# Patient Record
Sex: Female | Born: 1945 | Race: Black or African American | Hispanic: No | Marital: Single | Smoking: Never smoker
Health system: Southern US, Community
[De-identification: ages and names within clinical notes are randomized; demographics above are authoritative.]

## PROBLEM LIST (undated history)

## (undated) DIAGNOSIS — T25022A Burn of unspecified degree of left foot, initial encounter: Secondary | ICD-10-CM

## (undated) DIAGNOSIS — G35 Multiple sclerosis: Secondary | ICD-10-CM

## (undated) DIAGNOSIS — E785 Hyperlipidemia, unspecified: Secondary | ICD-10-CM

## (undated) HISTORY — PX: OTHER SURGICAL HISTORY: SHX169

---

## 2010-03-02 DIAGNOSIS — G35 Multiple sclerosis: Secondary | ICD-10-CM | POA: Diagnosis present

## 2010-12-19 ENCOUNTER — Other Ambulatory Visit: Payer: Self-pay | Admitting: Geriatric Medicine

## 2011-01-14 ENCOUNTER — Emergency Department: Payer: Self-pay | Admitting: Emergency Medicine

## 2011-05-22 ENCOUNTER — Emergency Department: Payer: Self-pay

## 2011-07-13 ENCOUNTER — Other Ambulatory Visit: Payer: Self-pay | Admitting: Geriatric Medicine

## 2012-10-08 ENCOUNTER — Other Ambulatory Visit: Payer: Self-pay | Admitting: Family Medicine

## 2012-10-08 LAB — PROTIME-INR
INR: 2
Prothrombin Time: 22.6 secs — ABNORMAL HIGH (ref 11.5–14.7)

## 2017-09-06 DIAGNOSIS — I10 Essential (primary) hypertension: Secondary | ICD-10-CM | POA: Diagnosis present

## 2017-09-19 ENCOUNTER — Other Ambulatory Visit: Payer: Self-pay | Admitting: Internal Medicine

## 2017-09-19 DIAGNOSIS — R0789 Other chest pain: Secondary | ICD-10-CM

## 2017-09-19 DIAGNOSIS — I208 Other forms of angina pectoris: Secondary | ICD-10-CM

## 2017-09-28 ENCOUNTER — Ambulatory Visit
Admission: RE | Admit: 2017-09-28 | Discharge: 2017-09-28 | Disposition: A | Discharge status: Home / Self Care | Payer: Medicare Other | Source: Ambulatory Visit | Attending: Internal Medicine | Admitting: Internal Medicine

## 2017-09-28 DIAGNOSIS — R0789 Other chest pain: Secondary | ICD-10-CM | POA: Diagnosis present

## 2017-09-28 DIAGNOSIS — I208 Other forms of angina pectoris: Secondary | ICD-10-CM

## 2017-09-28 NOTE — Progress Notes (Signed)
*  PRELIMINARY RESULTS* Echocardiogram 2D Echocardiogram has been performed.  Cristela Blue 09/28/2017, 11:21 AM

## 2018-05-09 DIAGNOSIS — I82539 Chronic embolism and thrombosis of unspecified popliteal vein: Secondary | ICD-10-CM | POA: Diagnosis present

## 2019-01-15 ENCOUNTER — Other Ambulatory Visit (HOSPITAL_COMMUNITY): Payer: Self-pay | Admitting: Family Medicine

## 2019-01-20 LAB — NOVEL CORONAVIRUS, NAA: SARS-CoV-2, NAA: NOT DETECTED

## 2020-06-07 ENCOUNTER — Other Ambulatory Visit
Admission: RE | Admit: 2020-06-07 | Discharge: 2020-06-07 | Disposition: A | Discharge status: Home / Self Care | Payer: Medicare Other | Source: Ambulatory Visit | Attending: Internal Medicine | Admitting: Internal Medicine

## 2020-06-07 ENCOUNTER — Other Ambulatory Visit: Payer: Self-pay

## 2020-06-07 DIAGNOSIS — Z01812 Encounter for preprocedural laboratory examination: Secondary | ICD-10-CM | POA: Diagnosis present

## 2020-06-07 DIAGNOSIS — Z20822 Contact with and (suspected) exposure to covid-19: Secondary | ICD-10-CM | POA: Insufficient documentation

## 2020-06-08 ENCOUNTER — Encounter: Payer: Self-pay | Admitting: Internal Medicine

## 2020-06-08 LAB — SARS CORONAVIRUS 2 (TAT 6-24 HRS): SARS Coronavirus 2: NEGATIVE

## 2020-06-16 ENCOUNTER — Other Ambulatory Visit
Admission: RE | Admit: 2020-06-16 | Discharge: 2020-06-16 | Disposition: A | Discharge status: Home / Self Care | Payer: Medicare Other | Source: Ambulatory Visit | Attending: Internal Medicine | Admitting: Internal Medicine

## 2020-06-16 ENCOUNTER — Other Ambulatory Visit: Payer: Self-pay

## 2020-06-16 DIAGNOSIS — Z01812 Encounter for preprocedural laboratory examination: Secondary | ICD-10-CM | POA: Diagnosis present

## 2020-06-16 DIAGNOSIS — Z20822 Contact with and (suspected) exposure to covid-19: Secondary | ICD-10-CM | POA: Insufficient documentation

## 2020-06-16 LAB — SARS CORONAVIRUS 2 (TAT 6-24 HRS): SARS Coronavirus 2: NEGATIVE

## 2020-06-18 ENCOUNTER — Other Ambulatory Visit: Payer: Medicare Other

## 2020-06-21 ENCOUNTER — Ambulatory Visit: Payer: Medicare Other | Admitting: Certified Registered Nurse Anesthetist

## 2020-06-21 ENCOUNTER — Other Ambulatory Visit: Payer: Self-pay

## 2020-06-21 ENCOUNTER — Ambulatory Visit
Admission: RE | Admit: 2020-06-21 | Discharge: 2020-06-21 | Disposition: A | Discharge status: Skilled Nursing Facility | Payer: Medicare Other | Attending: Internal Medicine | Admitting: Internal Medicine

## 2020-06-21 ENCOUNTER — Encounter
Admission: RE | Disposition: A | Discharge status: Skilled Nursing Facility | Payer: Self-pay | Source: Home / Self Care | Attending: Internal Medicine

## 2020-06-21 ENCOUNTER — Encounter: Payer: Self-pay | Admitting: Internal Medicine

## 2020-06-21 DIAGNOSIS — R194 Change in bowel habit: Secondary | ICD-10-CM | POA: Diagnosis not present

## 2020-06-21 DIAGNOSIS — Z7901 Long term (current) use of anticoagulants: Secondary | ICD-10-CM | POA: Diagnosis not present

## 2020-06-21 DIAGNOSIS — Z86718 Personal history of other venous thrombosis and embolism: Secondary | ICD-10-CM | POA: Insufficient documentation

## 2020-06-21 DIAGNOSIS — R921 Mammographic calcification found on diagnostic imaging of breast: Secondary | ICD-10-CM | POA: Diagnosis present

## 2020-06-21 DIAGNOSIS — Z79899 Other long term (current) drug therapy: Secondary | ICD-10-CM | POA: Insufficient documentation

## 2020-06-21 HISTORY — DX: Multiple sclerosis: G35

## 2020-06-21 HISTORY — PX: COLONOSCOPY WITH PROPOFOL: SHX5780

## 2020-06-21 SURGERY — COLONOSCOPY WITH PROPOFOL
Anesthesia: General

## 2020-06-21 MED ORDER — LIDOCAINE HCL (CARDIAC) PF 100 MG/5ML IV SOSY
PREFILLED_SYRINGE | INTRAVENOUS | Status: DC | PRN
  Administered 2020-06-21: 80 mg via INTRAVENOUS

## 2020-06-21 MED ORDER — LIDOCAINE HCL (PF) 1 % IJ SOLN
INTRAMUSCULAR | Status: AC
  Administered 2020-06-21: 0.2 mL
  Filled 2020-06-21: qty 2

## 2020-06-21 MED ORDER — SODIUM CHLORIDE 0.9 % IV SOLN: INTRAVENOUS | Status: DC

## 2020-06-21 MED ORDER — PROPOFOL 500 MG/50ML IV EMUL
INTRAVENOUS | Status: DC | PRN
  Administered 2020-06-21: 140 ug/kg/min via INTRAVENOUS

## 2020-06-21 MED ORDER — PROPOFOL 10 MG/ML IV BOLUS
INTRAVENOUS | Status: DC | PRN
  Administered 2020-06-21: 40 mg via INTRAVENOUS

## 2020-06-21 NOTE — Progress Notes (Signed)
Dr Norma Fredrickson spoke to the patient and her caregiver at the bedside following her procedure.  Results of the procedure as well as the future treatment plan were discussed and the patient verbalized understanding.  Written procedure report as well as written discharge instructions provided to the patient.  Medical Necessity forms prepared for EMS transport to [facility] upon discharge.  Patient denies pain/discomfort.  Caregiver remains at the bedside.

## 2020-06-21 NOTE — Op Note (Signed)
[facility]enter[facility] Patient Name: Donna Adkins Procedure Date: 06/21/2020 3:24 PM MRN: [id number] Account #: [id number] Date of Birth: 28-Dec-1945 Admit Type: Outpatient Age: 74 Room: [facility] ENDO ROOM 2 Gender: Female Note Status: Finalized Procedure:             Colonoscopy Indications:           Hematochezia, Change in bowel habits Providers:             Boykin Nearing. Coby Antrobus MD, MD Medicines:             Propofol per Anesthesia Complications:         No immediate complications. Estimated blood loss: None. Procedure:             Pre-Anesthesia Assessment:                        - The risks and benefits of the procedure and the                         sedation options and risks were discussed with the                         patient. All questions were answered and informed                         consent was obtained.                        - Patient identification and proposed procedure were                         verified prior to the procedure by the nurse. The                         procedure was verified in the procedure room.                        - ASA Grade Assessment: III - A patient with severe                         systemic disease.                        - After reviewing the risks and benefits, the patient                         was deemed in satisfactory condition to undergo the                         procedure.                        After obtaining informed consent, the colonoscope was                         passed under direct vision. Throughout the procedure,                         the patient's blood pressure, pulse, and oxygen  saturations were monitored continuously. The procedure                         was aborted. The colonscope was not inserted.                         Medications were given. The colonoscopy was aborted                         due to inadequate bowel prep. Findings:      The perianal  and digital rectal examinations were normal. Pertinent       negatives include no palpable rectal lesions.      Copious amount of semiliquid stool came passing per rectum. It was       decided at this point to abort the procedure. Estimated blood loss:       none. Scope NOT inserted. Impression:            - The procedure was aborted due to inadequate bowel                         prep.                        - No specimens collected. Recommendation:        - Patient has a contact number available for                         emergencies. The signs and symptoms of potential                         delayed complications were discussed with the patient.                         Return to normal activities tomorrow. Written                         discharge instructions were provided to the patient.                        - Resume previous diet.                        - Continue present medications.                        - Resume Eliquis (apixaban) at prior dose today. Refer                         to managing physician for further adjustment of                         therapy.                        - I advise 'pre-prep' of colon for two weeks prior to                         next scheduled colonoscopy. Take Miralax 17g (one  capful) daily mixed in liquid of choice daily for two                         weeks leading up to the procedure IN ADDITION to the                         Large volume prep the day prior to the procedure.                        - Follow up in office with PA to manage constipation                         better, then reschedule colonoscopy in 1-3 months with                         low residue diet for 3 days with large volume prep day                         prior to the procedure.                        - The findings and recommendations were discussed with                         the patient. Diagnosis Code(s):     --- Professional ---                         K92.1, Melena (includes Hematochezia)                        R19.4, Change in bowel habit Attending Participation:      I personally performed the entire procedure. [name] MD, MD 06/21/2020 4:05:38 PM This report has been signed electronically. Number of Addenda: 0 Note Initiated On: 06/21/2020 3:24 PM Estimated Blood Loss:  Estimated blood loss: none.      [facility]

## 2020-06-21 NOTE — Interval H&P Note (Signed)
History and Physical Interval Note:  06/21/2020 1:30 PM  Donna Adkins  has presented today for surgery, with the diagnosis of CHANGE IN [name] HEMATCCHEZIA.  The various methods of treatment have been discussed with the patient and family. After consideration of risks, benefits and other options for treatment, the patient has consented to  Procedure(s): COLONOSCOPY WITH PROPOFOL (N/A) as a surgical intervention.  The patient's history has been reviewed, patient examined, no change in status, stable for surgery.  I have reviewed the patient's chart and labs.  Questions were answered to the patient's satisfaction.     [name], [name]

## 2020-06-21 NOTE — Transfer of Care (Signed)
Immediate Anesthesia Transfer of Care Note  Patient: Donna Adkins  Procedure(s) Performed: COLONOSCOPY WITH PROPOFOL (N/A )  Patient Location: PACU and Endoscopy Unit  Anesthesia Type:General  Level of Consciousness: drowsy  Airway & Oxygen Therapy: Patient Spontanous Breathing  Post-op Assessment: Report given to RN and Post -op Vital signs reviewed and stable  Post vital signs: Reviewed and stable  Last Vitals:  Vitals Value Taken Time  BP 105/60 06/21/20 1544  Temp 36.4 C 06/21/20 1544  Pulse 46 06/21/20 1546  Resp 15 06/21/20 1546  SpO2 100 % 06/21/20 1546  Vitals shown include unvalidated device data.  Last Pain:  Vitals:   06/21/20 1544  TempSrc: Temporal  PainSc: Asleep         Complications: No complications documented.

## 2020-06-21 NOTE — H&P (Signed)
Outpatient short stay form Pre-procedure 06/21/2020 1:27 PM Donna Adkins K. Norma Fredrickson, M.D.  Primary Physician: Judie Grieve, M.D.  Reason for visit:  Change in bowel habits, hematochezia  History of present illness:  Patient with hx of MS, DVT on Eliquis presents for diagnostic colonoscopy for chronic constipation and intemittent hematochezia. Patient is colonoscopy naive.    No current facility-administered medications for this encounter.  Medications Prior to Admission  Medication Sig Dispense Refill Last Dose  . apixaban (ELIQUIS) 2.5 MG TABS tablet Take by mouth 2 (two) times daily.     . Biotin 10 MG CAPS Take by mouth.     . Calcium-Vitamin D (CALTRATE 600 PLUS-VIT D PO) Take by mouth.     . Coenzyme Q10-Fish Oil-Vit E (CO-Q 10 OMEGA-3 FISH OIL) CAPS Take by mouth.     . fenofibrate (TRICOR) 48 MG tablet Take 48 mg by mouth daily.     Marland Kitchen gabapentin (NEURONTIN) 300 MG capsule Take 300 mg by mouth 2 (two) times daily.     Marland Kitchen GARLIC PO Take by mouth.     . Multiple Vitamin (MULTIVITAMIN) tablet Take 1 tablet by mouth daily.     . polyethylene glycol (MIRALAX / GLYCOLAX) 17 g packet Take 17 g by mouth daily.        No Known Allergies   Past Medical History:  Diagnosis Date  . Multiple sclerosis (HCC)     Review of systems:  Otherwise negative.    Physical Exam  Gen: Alert, oriented. Appears stated age.  HEENT: [place]/AT. PERRLA. Lungs: CTA, no wheezes. CV: RR nl S1, S2. Abd: soft, benign, no masses. BS+ Ext: No edema. Pulses 2+    Planned procedures: Proceed with colonoscopy. The patient understands the nature of the planned procedure, indications, risks, alternatives and potential complications including but not limited to bleeding, infection, perforation, damage to internal organs and possible oversedation/side effects from anesthesia. The patient agrees and gives consent to proceed.  Please refer to procedure notes for findings, recommendations and patient  disposition/instructions.     Azaryah Oleksy K. Norma Fredrickson, M.D. Gastroenterology 06/21/2020  1:27 PM

## 2020-06-21 NOTE — Anesthesia Procedure Notes (Signed)
Procedure Name: MAC Date/Time: 06/21/2020 3:28 PM Performed by: Lily Peer, Keshara Kiger, CRNA Pre-anesthesia Checklist: Patient identified, Emergency Drugs available, Suction available, Patient being monitored and Timeout performed Patient Re-evaluated:Patient Re-evaluated prior to induction Oxygen Delivery Method: Simple face mask Induction Type: IV induction

## 2020-06-21 NOTE — OR Nursing (Signed)
We were about to start procedure but copious amounts of liquid stool poured out.  Dr Norma Fredrickson cancelled  procedure

## 2020-06-21 NOTE — Anesthesia Preprocedure Evaluation (Signed)
Anesthesia Evaluation  Patient identified by MRN, date of birth, ID band Patient awake    Reviewed: Allergy & Precautions, NPO status , Patient's Chart, lab work & pertinent test results  Airway Mallampati: III       Dental  (+) Poor Dentition   Pulmonary neg pulmonary ROS,    Pulmonary exam normal breath sounds clear to auscultation       Cardiovascular negative cardio ROS Normal cardiovascular exam Rhythm:Regular Rate:Normal     Neuro/Psych MS negative psych ROS   GI/Hepatic negative GI ROS, Neg liver ROS,   Endo/Other  negative endocrine ROS  Renal/GU negative Renal ROS  negative genitourinary   Musculoskeletal negative musculoskeletal ROS (+)   Abdominal   Peds negative pediatric ROS (+)  Hematology negative hematology ROS (+)   Anesthesia Other Findings .Marland KitchenPast Medical History: No date: Multiple sclerosis (HCC)   Reproductive/Obstetrics negative OB ROS                             Anesthesia Physical Anesthesia Plan  ASA: III  Anesthesia Plan: General   Post-op Pain Management:    Induction: Intravenous  PONV Risk Score and Plan: 3 and Propofol infusion  Airway Management Planned: Nasal Cannula  Additional Equipment:   Intra-op Plan:   Post-operative Plan:   Informed Consent: I have reviewed the patients History and Physical, chart, labs and discussed the procedure including the risks, benefits and alternatives for the proposed anesthesia with the patient or authorized representative who has indicated his/her understanding and acceptance.       Plan Discussed with: CRNA, Anesthesiologist and Surgeon  Anesthesia Plan Comments:         Anesthesia Quick Evaluation

## 2020-06-22 ENCOUNTER — Encounter: Payer: Self-pay | Admitting: Internal Medicine

## 2020-06-22 NOTE — Anesthesia Postprocedure Evaluation (Signed)
Anesthesia Post Note  Patient: Donna Adkins  Procedure(s) Performed: COLONOSCOPY WITH PROPOFOL (N/A )  Patient location during evaluation: Endoscopy Anesthesia Type: General Level of consciousness: awake and awake and alert Pain management: pain level controlled Vital Signs Assessment: post-procedure vital signs reviewed and stable Respiratory status: spontaneous breathing Cardiovascular status: blood pressure returned to baseline Postop Assessment: no apparent nausea or vomiting Anesthetic complications: no   No complications documented.   Last Vitals:  Vitals:   06/21/20 1554 06/21/20 1604  BP: 137/77 131/74  Pulse: 66 64  Resp: 12 13  Temp:    SpO2: 100% 100%    Last Pain:  Vitals:   06/22/20 0733  TempSrc:   PainSc: 0-No pain                 Emilio Math

## 2020-07-19 ENCOUNTER — Other Ambulatory Visit: Payer: Self-pay

## 2020-07-19 ENCOUNTER — Other Ambulatory Visit
Admission: RE | Admit: 2020-07-19 | Discharge: 2020-07-19 | Disposition: A | Discharge status: Home / Self Care | Payer: Medicare Other | Source: Ambulatory Visit | Attending: Surgery | Admitting: Surgery

## 2020-07-19 DIAGNOSIS — Z01812 Encounter for preprocedural laboratory examination: Secondary | ICD-10-CM | POA: Insufficient documentation

## 2020-07-19 DIAGNOSIS — Z20822 Contact with and (suspected) exposure to covid-19: Secondary | ICD-10-CM | POA: Diagnosis not present

## 2020-07-20 ENCOUNTER — Encounter: Payer: Self-pay | Admitting: Internal Medicine

## 2020-07-20 LAB — SARS CORONAVIRUS 2 (TAT 6-24 HRS): SARS Coronavirus 2: NEGATIVE

## 2020-07-21 ENCOUNTER — Encounter: Payer: Self-pay | Admitting: Internal Medicine

## 2020-07-21 ENCOUNTER — Other Ambulatory Visit: Payer: Self-pay

## 2020-07-21 ENCOUNTER — Ambulatory Visit: Payer: Medicare Other | Admitting: Certified Registered Nurse Anesthetist

## 2020-07-21 ENCOUNTER — Ambulatory Visit
Admission: RE | Admit: 2020-07-21 | Discharge: 2020-07-21 | Disposition: A | Discharge status: Skilled Nursing Facility | Payer: Medicare Other | Attending: Internal Medicine | Admitting: Internal Medicine

## 2020-07-21 ENCOUNTER — Encounter
Admission: RE | Disposition: A | Discharge status: Skilled Nursing Facility | Payer: Self-pay | Source: Home / Self Care | Attending: Internal Medicine

## 2020-07-21 DIAGNOSIS — K641 Second degree hemorrhoids: Secondary | ICD-10-CM | POA: Insufficient documentation

## 2020-07-21 DIAGNOSIS — K5904 Chronic idiopathic constipation: Secondary | ICD-10-CM | POA: Insufficient documentation

## 2020-07-21 DIAGNOSIS — Z86718 Personal history of other venous thrombosis and embolism: Secondary | ICD-10-CM | POA: Diagnosis not present

## 2020-07-21 DIAGNOSIS — K5730 Diverticulosis of large intestine without perforation or abscess without bleeding: Secondary | ICD-10-CM | POA: Diagnosis not present

## 2020-07-21 DIAGNOSIS — G35 Multiple sclerosis: Secondary | ICD-10-CM | POA: Insufficient documentation

## 2020-07-21 DIAGNOSIS — Z79899 Other long term (current) drug therapy: Secondary | ICD-10-CM | POA: Diagnosis not present

## 2020-07-21 DIAGNOSIS — K921 Melena: Secondary | ICD-10-CM | POA: Diagnosis not present

## 2020-07-21 DIAGNOSIS — Z7901 Long term (current) use of anticoagulants: Secondary | ICD-10-CM | POA: Diagnosis not present

## 2020-07-21 HISTORY — DX: Burn of unspecified degree of left foot, initial encounter: T25.022A

## 2020-07-21 HISTORY — PX: COLONOSCOPY WITH PROPOFOL: SHX5780

## 2020-07-21 SURGERY — COLONOSCOPY WITH PROPOFOL
Anesthesia: General

## 2020-07-21 MED ORDER — EPHEDRINE SULFATE 50 MG/ML IJ SOLN
INTRAMUSCULAR | Status: DC | PRN
  Administered 2020-07-21: 10 mg via INTRAVENOUS

## 2020-07-21 MED ORDER — PROPOFOL 10 MG/ML IV BOLUS
INTRAVENOUS | Status: AC
  Filled 2020-07-21: qty 40

## 2020-07-21 MED ORDER — ESMOLOL HCL 100 MG/10ML IV SOLN
INTRAVENOUS | Status: DC | PRN
  Administered 2020-07-21: 20 mg via INTRAVENOUS

## 2020-07-21 MED ORDER — LIDOCAINE HCL (PF) 1 % IJ SOLN
INTRAMUSCULAR | Status: AC
  Filled 2020-07-21: qty 2

## 2020-07-21 MED ORDER — PROPOFOL 500 MG/50ML IV EMUL
INTRAVENOUS | Status: DC | PRN
  Administered 2020-07-21: 65 ug/kg/min via INTRAVENOUS

## 2020-07-21 MED ORDER — PROPOFOL 10 MG/ML IV BOLUS
INTRAVENOUS | Status: DC | PRN
  Administered 2020-07-21: 50 mg via INTRAVENOUS

## 2020-07-21 MED ORDER — LIDOCAINE HCL (PF) 2 % IJ SOLN
INTRAMUSCULAR | Status: AC
  Filled 2020-07-21: qty 10

## 2020-07-21 MED ORDER — LIDOCAINE HCL (CARDIAC) PF 100 MG/5ML IV SOSY
PREFILLED_SYRINGE | INTRAVENOUS | Status: DC | PRN
  Administered 2020-07-21: 60 mg via INTRAVENOUS

## 2020-07-21 MED ORDER — SODIUM CHLORIDE 0.9 % IV SOLN: INTRAVENOUS | Status: DC

## 2020-07-21 MED ORDER — DEXMEDETOMIDINE (PRECEDEX) IN NS 20 MCG/5ML (4 MCG/ML) IV SYRINGE
PREFILLED_SYRINGE | INTRAVENOUS | Status: AC
  Filled 2020-07-21: qty 5

## 2020-07-21 MED ORDER — ESMOLOL HCL 100 MG/10ML IV SOLN
INTRAVENOUS | Status: AC
  Filled 2020-07-21: qty 10
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
## 2020-07-21 NOTE — Interval H&P Note (Signed)
History and Physical Interval Note:  07/21/2020 10:51 AM  Donna Adkins  has presented today for surgery, with the diagnosis of CHRONIC CONSTIPATION CHANGE BH.  The various methods of treatment have been discussed with the patient and family. After consideration of risks, benefits and other options for treatment, the patient has consented to  Procedure(s) with comments: COLONOSCOPY WITH PROPOFOL (N/A) - EMT WILL BRING PATIENT as a surgical intervention.  The patient's history has been reviewed, patient examined, no change in status, stable for surgery.  I have reviewed the patient's chart and labs.  Questions were answered to the patient's satisfaction.     [place], [place]

## 2020-07-21 NOTE — Op Note (Signed)
[facility] Gastroenterology Patient Name: Donna Adkins Procedure Date: 07/21/2020 11:36 AM MRN: [id number] Account #: [id number] Date of Birth: 1946-03-08 Admit Type: Outpatient Age: 74 Room: [facility] ENDO ROOM 2 Gender: Female Note Status: Finalized Procedure:             Colonoscopy Indications:           Hematochezia, Change in bowel habits, Chronic                         idiopathic constipation Providers:             Boykin Nearing. Norma Fredrickson MD, MD Referring MD:          No Local Md, MD (Referring MD) Medicines:             Propofol per Anesthesia Complications:         No immediate complications. Procedure:             Pre-Anesthesia Assessment:                        - The risks and benefits of the procedure and the                         sedation options and risks were discussed with the                         patient. All questions were answered and informed                         consent was obtained.                        - Patient identification and proposed procedure were                         verified prior to the procedure by the nurse. The                         procedure was verified in the procedure room.                        - ASA Grade Assessment: III - A patient with severe                         systemic disease.                        - After reviewing the risks and benefits, the patient                         was deemed in satisfactory condition to undergo the                         procedure.                        After obtaining informed consent, the colonoscope was                         passed under direct vision. Throughout the procedure,  the patient's blood pressure, pulse, and oxygen                         saturations were monitored continuously. The                         Colonoscope was introduced through the anus and                         advanced to the the cecum, identified by appendiceal                          orifice and ileocecal valve. The colonoscopy was                         performed without difficulty. The patient tolerated                         the procedure well. The quality of the bowel                         preparation was good. The ileocecal valve, appendiceal                         orifice, and rectum were photographed. Findings:      The perianal exam findings include internal hemorrhoids that prolapse       with straining, but spontaneously regress to the resting position (Grade       II).      Non-bleeding internal hemorrhoids were found during retroflexion. The       hemorrhoids were Grade I (internal hemorrhoids that do not prolapse).      A few small-mouthed diverticula were found in the sigmoid colon.      The exam was otherwise without abnormality. Impression:            - Internal hemorrhoids that prolapse with straining,                         but spontaneously regress to the resting position                         (Grade II) found on perianal exam.                        - Non-bleeding internal hemorrhoids.                        - Diverticulosis in the sigmoid colon.                        - The examination was otherwise normal.                        - No specimens collected. Recommendation:        - Patient has a contact number available for                         emergencies. The signs and symptoms of potential  delayed complications were discussed with the patient.                         Return to normal activities tomorrow. Written                         discharge instructions were provided to the patient.                        - Resume previous diet.                        - Continue present medications.                        - No repeat colonoscopy due to current age (65 years                         or older) and the absence of colonic polyps.                        - High fiber diet.                         - Return to GI office PRN.                        - You do NOT require further colon cancer screening                         measures (Annual stool testing (i.e. hemoccult, FIT,                         cologuard), sigmoidoscopy, colonoscopy or CT                         colonography). You should share this recommendation                         with your Primary Care provider.                        - The findings and recommendations were discussed with                         the patient. Procedure Code(s):     --- Professional ---                        [phone], Colonoscopy, flexible; diagnostic, including                         collection of specimen(s) by brushing or washing, when                         performed (separate procedure) Diagnosis Code(s):     --- Professional ---                        K57.30, Diverticulosis of large intestine without  perforation or abscess without bleeding                        K59.04, Chronic idiopathic constipation                        R19.4, Change in bowel habit                        K92.1, Melena (includes Hematochezia)                        K64.1, Second degree hemorrhoids CPT copyright 2019 American Medical [employer]. All rights reserved. The codes documented in this report are preliminary and upon coder review may  be revised to meet current compliance requirements. [name] MD, MD 07/21/2020 12:16:07 PM This report has been signed electronically. Number of Addenda: 0 Note Initiated On: 07/21/2020 11:36 AM Scope Withdrawal Time: 0 hours 6 minutes 10 seconds  Total Procedure Duration: 0 hours 11 minutes 9 seconds  Estimated Blood Loss:  Estimated blood loss: none.      [facility]
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
## 2020-07-21 NOTE — Transfer of Care (Signed)
Immediate Anesthesia Transfer of Care Note  Patient: Donna Adkins  Procedure(s) Performed: COLONOSCOPY WITH PROPOFOL (N/A )  Patient Location: PACU and Endoscopy Unit  Anesthesia Type:General  Level of Consciousness: awake, oriented and patient cooperative  Airway & Oxygen Therapy: Patient Spontanous Breathing  Post-op Assessment: Report given to RN and Post -op Vital signs reviewed and stable  Post vital signs: Reviewed and stable  Last Vitals:  Vitals Value Taken Time  BP 102/67 07/21/20 1219  Temp 35.9 C 07/21/20 1219  Pulse 92 07/21/20 1223  Resp 14 07/21/20 1223  SpO2 99 % 07/21/20 1223  Vitals shown include unvalidated device data.  Last Pain:  Vitals:   07/21/20 1219  TempSrc: Temporal  PainSc: 0-No pain         Complications: No complications documented.

## 2020-07-22 ENCOUNTER — Encounter: Payer: Self-pay | Admitting: Internal Medicine

## 2020-07-22 NOTE — Anesthesia Postprocedure Evaluation (Signed)
Anesthesia Post Note  Patient: Donna Adkins  Procedure(s) Performed: COLONOSCOPY WITH PROPOFOL (N/A )  Patient location during evaluation: PACU Anesthesia Type: General Level of consciousness: awake and alert Pain management: pain level controlled Vital Signs Assessment: post-procedure vital signs reviewed and stable Respiratory status: spontaneous breathing, nonlabored ventilation and respiratory function stable Cardiovascular status: blood pressure returned to baseline and stable Postop Assessment: no apparent nausea or vomiting Anesthetic complications: no   No complications documented.   Last Vitals:  Vitals:   07/21/20 1240 07/21/20 1250  BP: 125/66 (!) 135/101  Pulse: 74 73  Resp: 17 16  Temp:    SpO2: 100% 100%    Last Pain:  Vitals:   07/22/20 0739  TempSrc:   PainSc: 0-No pain                 Karleen Hampshire

## 2021-02-10 ENCOUNTER — Emergency Department: Payer: Medicare Other

## 2021-02-10 ENCOUNTER — Emergency Department
Admission: EM | Admit: 2021-02-10 | Discharge: 2021-02-10 | Disposition: A | Discharge status: Home / Self Care | Payer: Medicare Other | Attending: Emergency Medicine | Admitting: Emergency Medicine

## 2021-02-10 ENCOUNTER — Encounter: Payer: Self-pay | Admitting: Emergency Medicine

## 2021-02-10 DIAGNOSIS — Z7901 Long term (current) use of anticoagulants: Secondary | ICD-10-CM | POA: Diagnosis not present

## 2021-02-10 DIAGNOSIS — S72052A Unspecified fracture of head of left femur, initial encounter for closed fracture: Secondary | ICD-10-CM | POA: Insufficient documentation

## 2021-02-10 DIAGNOSIS — S72009A Fracture of unspecified part of neck of unspecified femur, initial encounter for closed fracture: Secondary | ICD-10-CM

## 2021-02-10 DIAGNOSIS — X58XXXA Exposure to other specified factors, initial encounter: Secondary | ICD-10-CM | POA: Diagnosis not present

## 2021-02-10 DIAGNOSIS — S79912A Unspecified injury of left hip, initial encounter: Secondary | ICD-10-CM | POA: Diagnosis present

## 2021-02-10 DIAGNOSIS — R52 Pain, unspecified: Secondary | ICD-10-CM

## 2021-02-10 MED ORDER — OXYCODONE HCL 5 MG PO TABS
5.0000 mg | ORAL_TABLET | Freq: Four times a day (QID) | ORAL | 0 refills | Status: DC | PRN
Start: 2021-02-10 — End: 2021-10-03

## 2021-02-10 MED ORDER — OXYCODONE HCL 5 MG PO TABS
5.0000 mg | ORAL_TABLET | Freq: Once | ORAL | Status: AC
Start: 2021-02-10 — End: 2021-02-10
  Administered 2021-02-10: 5 mg via ORAL
  Filled 2021-02-10: qty 1

## 2021-02-10 NOTE — ED Notes (Signed)
[phone]

## 2021-02-10 NOTE — Discharge Instructions (Addendum)
Contact Dr. Odis Luster office for reevaluation and further treatment of your hip fracture. A prescription for pain medication was written to be taken as needed for pain to [facility] in [place].

## 2021-02-10 NOTE — ED Notes (Signed)
Pts POA is [employer] [phone]
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
## 2021-09-30 ENCOUNTER — Other Ambulatory Visit: Payer: Self-pay

## 2021-09-30 ENCOUNTER — Inpatient Hospital Stay
Admission: EM | Admit: 2021-09-30 | Discharge: 2021-10-03 | DRG: 481 | Disposition: A | Discharge status: Skilled Nursing Facility | Payer: Medicare Other | Source: Skilled Nursing Facility | Attending: Internal Medicine | Admitting: Internal Medicine

## 2021-09-30 ENCOUNTER — Emergency Department: Payer: Medicare Other

## 2021-09-30 DIAGNOSIS — Z7401 Bed confinement status: Secondary | ICD-10-CM

## 2021-09-30 DIAGNOSIS — I10 Essential (primary) hypertension: Secondary | ICD-10-CM | POA: Diagnosis present

## 2021-09-30 DIAGNOSIS — X58XXXA Exposure to other specified factors, initial encounter: Secondary | ICD-10-CM | POA: Diagnosis present

## 2021-09-30 DIAGNOSIS — I9581 Postprocedural hypotension: Secondary | ICD-10-CM | POA: Diagnosis not present

## 2021-09-30 DIAGNOSIS — Y92129 Unspecified place in nursing home as the place of occurrence of the external cause: Secondary | ICD-10-CM

## 2021-09-30 DIAGNOSIS — Z79899 Other long term (current) drug therapy: Secondary | ICD-10-CM

## 2021-09-30 DIAGNOSIS — S72001A Fracture of unspecified part of neck of right femur, initial encounter for closed fracture: Secondary | ICD-10-CM | POA: Diagnosis not present

## 2021-09-30 DIAGNOSIS — I82539 Chronic embolism and thrombosis of unspecified popliteal vein: Secondary | ICD-10-CM | POA: Diagnosis present

## 2021-09-30 DIAGNOSIS — Z7901 Long term (current) use of anticoagulants: Secondary | ICD-10-CM

## 2021-09-30 DIAGNOSIS — S7291XA Unspecified fracture of right femur, initial encounter for closed fracture: Secondary | ICD-10-CM

## 2021-09-30 DIAGNOSIS — G35 Multiple sclerosis: Secondary | ICD-10-CM | POA: Diagnosis present

## 2021-09-30 DIAGNOSIS — T45515A Adverse effect of anticoagulants, initial encounter: Secondary | ICD-10-CM | POA: Diagnosis present

## 2021-09-30 DIAGNOSIS — E785 Hyperlipidemia, unspecified: Secondary | ICD-10-CM | POA: Diagnosis present

## 2021-09-30 DIAGNOSIS — Z86718 Personal history of other venous thrombosis and embolism: Secondary | ICD-10-CM

## 2021-09-30 DIAGNOSIS — Z20822 Contact with and (suspected) exposure to covid-19: Secondary | ICD-10-CM | POA: Diagnosis present

## 2021-09-30 DIAGNOSIS — D62 Acute posthemorrhagic anemia: Secondary | ICD-10-CM | POA: Diagnosis present

## 2021-09-30 HISTORY — DX: Hyperlipidemia, unspecified: E78.5

## 2021-09-30 LAB — CBC WITH DIFFERENTIAL/PLATELET
Abs Immature Granulocytes: 0.01 10*3/uL (ref 0.00–0.07)
Basophils Absolute: 0 10*3/uL (ref 0.0–0.1)
Basophils Relative: 0 %
Eosinophils Absolute: 0.4 10*3/uL (ref 0.0–0.5)
Eosinophils Relative: 6 %
HCT: 33.2 % — ABNORMAL LOW (ref 36.0–46.0)
Hemoglobin: 10.8 g/dL — ABNORMAL LOW (ref 12.0–15.0)
Immature Granulocytes: 0 %
Lymphocytes Relative: 13 %
Lymphs Abs: 0.9 10*3/uL (ref 0.7–4.0)
MCH: 30.5 pg (ref 26.0–34.0)
MCHC: 32.5 g/dL (ref 30.0–36.0)
MCV: 93.8 fL (ref 80.0–100.0)
Monocytes Absolute: 0.7 10*3/uL (ref 0.1–1.0)
Monocytes Relative: 10 %
Neutro Abs: 4.8 10*3/uL (ref 1.7–7.7)
Neutrophils Relative %: 71 %
Platelets: 191 10*3/uL (ref 150–400)
RBC: 3.54 MIL/uL — ABNORMAL LOW (ref 3.87–5.11)
RDW: 14.8 % (ref 11.5–15.5)
WBC: 6.8 10*3/uL (ref 4.0–10.5)
nRBC: 0 % (ref 0.0–0.2)

## 2021-09-30 LAB — PROTIME-INR
INR: 1.4 — ABNORMAL HIGH (ref 0.8–1.2)
Prothrombin Time: 17.4 seconds — ABNORMAL HIGH (ref 11.4–15.2)

## 2021-09-30 LAB — BASIC METABOLIC PANEL
Anion gap: 5 (ref 5–15)
BUN: 12 mg/dL (ref 8–23)
CO2: 26 mmol/L (ref 22–32)
Calcium: 9.4 mg/dL (ref 8.9–10.3)
Chloride: 104 mmol/L (ref 98–111)
Creatinine, Ser: 0.59 mg/dL (ref 0.44–1.00)
GFR, Estimated: >60 mL/min (ref >60)
Glucose, Bld: 144 mg/dL — ABNORMAL HIGH (ref 70–99)
Potassium: 4 mmol/L (ref 3.5–5.1)
Sodium: 135 mmol/L (ref 135–145)

## 2021-09-30 LAB — APTT: aPTT: 44 seconds — ABNORMAL HIGH (ref 24–36)

## 2021-09-30 NOTE — ED Notes (Signed)
Patient transported to X-ray 
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
## 2021-09-30 NOTE — ED Triage Notes (Signed)
Pt c/o right leg pain. EMS reports pt c/o right leg pain, facility ordered x-ray that showed a femoral neck fracture.  ?

## 2021-10-01 ENCOUNTER — Encounter
Admission: EM | Disposition: A | Discharge status: Skilled Nursing Facility | Payer: Self-pay | Source: Skilled Nursing Facility | Attending: Internal Medicine

## 2021-10-01 ENCOUNTER — Inpatient Hospital Stay: Payer: Medicare Other

## 2021-10-01 ENCOUNTER — Inpatient Hospital Stay: Payer: Medicare Other | Admitting: Anesthesiology

## 2021-10-01 ENCOUNTER — Other Ambulatory Visit: Payer: Self-pay

## 2021-10-01 ENCOUNTER — Encounter: Payer: Self-pay | Admitting: Internal Medicine

## 2021-10-01 DIAGNOSIS — T45515A Adverse effect of anticoagulants, initial encounter: Secondary | ICD-10-CM | POA: Diagnosis present

## 2021-10-01 DIAGNOSIS — Z20822 Contact with and (suspected) exposure to covid-19: Secondary | ICD-10-CM | POA: Diagnosis present

## 2021-10-01 DIAGNOSIS — I10 Essential (primary) hypertension: Secondary | ICD-10-CM

## 2021-10-01 DIAGNOSIS — I9581 Postprocedural hypotension: Secondary | ICD-10-CM | POA: Diagnosis not present

## 2021-10-01 DIAGNOSIS — Z7901 Long term (current) use of anticoagulants: Secondary | ICD-10-CM | POA: Diagnosis not present

## 2021-10-01 DIAGNOSIS — X58XXXA Exposure to other specified factors, initial encounter: Secondary | ICD-10-CM | POA: Diagnosis present

## 2021-10-01 DIAGNOSIS — D62 Acute posthemorrhagic anemia: Secondary | ICD-10-CM | POA: Diagnosis present

## 2021-10-01 DIAGNOSIS — Z86718 Personal history of other venous thrombosis and embolism: Secondary | ICD-10-CM | POA: Diagnosis not present

## 2021-10-01 DIAGNOSIS — S72001A Fracture of unspecified part of neck of right femur, initial encounter for closed fracture: Secondary | ICD-10-CM | POA: Diagnosis present

## 2021-10-01 DIAGNOSIS — G35 Multiple sclerosis: Secondary | ICD-10-CM | POA: Diagnosis present

## 2021-10-01 DIAGNOSIS — E785 Hyperlipidemia, unspecified: Secondary | ICD-10-CM | POA: Diagnosis present

## 2021-10-01 DIAGNOSIS — Z7401 Bed confinement status: Secondary | ICD-10-CM | POA: Diagnosis not present

## 2021-10-01 DIAGNOSIS — Y92129 Unspecified place in nursing home as the place of occurrence of the external cause: Secondary | ICD-10-CM | POA: Diagnosis not present

## 2021-10-01 DIAGNOSIS — Z79899 Other long term (current) drug therapy: Secondary | ICD-10-CM | POA: Diagnosis not present

## 2021-10-01 DIAGNOSIS — I82539 Chronic embolism and thrombosis of unspecified popliteal vein: Secondary | ICD-10-CM

## 2021-10-01 HISTORY — PX: HIP PINNING,CANNULATED: SHX1758

## 2021-10-01 LAB — RESP PANEL BY RT-PCR (FLU A&B, COVID) ARPGX2
Influenza A by PCR: NEGATIVE
Influenza B by PCR: NEGATIVE
SARS Coronavirus 2 by RT PCR: NEGATIVE

## 2021-10-01 SURGERY — FIXATION, FEMUR, NECK, PERCUTANEOUS, USING SCREW
Anesthesia: General | Site: Hip | Laterality: Right

## 2021-10-01 MED ORDER — FENTANYL CITRATE (PF) 100 MCG/2ML IJ SOLN: 25.0000 ug | INTRAMUSCULAR | Status: DC | PRN

## 2021-10-01 MED ORDER — POLYETHYLENE GLYCOL 3350 17 G PO PACK
17.0000 g | PACK | Freq: Every day | ORAL | Status: DC
  Administered 2021-10-02 – 2021-10-03 (×2): 17 g via ORAL
  Filled 2021-10-01 (×2): qty 1

## 2021-10-01 MED ORDER — LIDOCAINE HCL (PF) 2 % IJ SOLN
INTRAMUSCULAR | Status: AC
  Filled 2021-10-01: qty 5

## 2021-10-01 MED ORDER — OXYCODONE HCL 5 MG PO TABS
5.0000 mg | ORAL_TABLET | Freq: Four times a day (QID) | ORAL | Status: DC | PRN
  Administered 2021-10-01: 5 mg via ORAL
  Filled 2021-10-01: qty 1

## 2021-10-01 MED ORDER — LACTATED RINGERS IV SOLN: INTRAVENOUS | Status: DC | PRN

## 2021-10-01 MED ORDER — MORPHINE SULFATE (PF) 2 MG/ML IV SOLN
1.0000 mg | INTRAVENOUS | Status: DC | PRN
  Administered 2021-10-01: 1 mg via INTRAVENOUS
  Filled 2021-10-01 (×2): qty 1

## 2021-10-01 MED ORDER — LACTATED RINGERS IV SOLN: INTRAVENOUS | Status: DC

## 2021-10-01 MED ORDER — FENTANYL CITRATE (PF) 100 MCG/2ML IJ SOLN
INTRAMUSCULAR | Status: DC | PRN
  Administered 2021-10-01: 50 ug via INTRAVENOUS
  Administered 2021-10-01 (×2): 25 ug via INTRAVENOUS
  Administered 2021-10-01: 50 ug via INTRAVENOUS

## 2021-10-01 MED ORDER — DOCUSATE SODIUM 100 MG PO CAPS
100.0000 mg | ORAL_CAPSULE | Freq: Every day | ORAL | Status: DC
  Administered 2021-10-02: 100 mg via ORAL
  Filled 2021-10-01 (×2): qty 1

## 2021-10-01 MED ORDER — CEFAZOLIN SODIUM-DEXTROSE 1-4 GM/50ML-% IV SOLN
1.0000 g | Freq: Three times a day (TID) | INTRAVENOUS | Status: DC
  Administered 2021-10-02 – 2021-10-03 (×5): 1 g via INTRAVENOUS
  Filled 2021-10-01 (×6): qty 50

## 2021-10-01 MED ORDER — PROPOFOL 10 MG/ML IV BOLUS
INTRAVENOUS | Status: DC | PRN
  Administered 2021-10-01: 100 mg via INTRAVENOUS

## 2021-10-01 MED ORDER — OXYCODONE HCL 5 MG PO TABS: 5.0000 mg | ORAL_TABLET | Freq: Once | ORAL | Status: DC | PRN

## 2021-10-01 MED ORDER — PHENYLEPHRINE HCL (PRESSORS) 10 MG/ML IV SOLN
INTRAVENOUS | Status: DC | PRN
  Administered 2021-10-01 (×4): 100 ug via INTRAVENOUS

## 2021-10-01 MED ORDER — FENTANYL CITRATE (PF) 100 MCG/2ML IJ SOLN
INTRAMUSCULAR | Status: AC
Start: 2021-10-01 — End: ?
  Filled 2021-10-01: qty 2

## 2021-10-01 MED ORDER — GABAPENTIN 300 MG PO CAPS
300.0000 mg | ORAL_CAPSULE | Freq: Two times a day (BID) | ORAL | Status: DC
  Administered 2021-10-01 – 2021-10-03 (×5): 300 mg via ORAL
  Filled 2021-10-01 (×5): qty 1

## 2021-10-01 MED ORDER — HYDROCODONE-ACETAMINOPHEN 5-325 MG PO TABS
1.0000 | ORAL_TABLET | Freq: Four times a day (QID) | ORAL | Status: DC | PRN
  Administered 2021-10-01 – 2021-10-03 (×4): 2 via ORAL
  Filled 2021-10-01 (×4): qty 2

## 2021-10-01 MED ORDER — OXYCODONE HCL 5 MG/5ML PO SOLN: 5.0000 mg | Freq: Once | ORAL | Status: DC | PRN

## 2021-10-01 MED ORDER — LIDOCAINE HCL (CARDIAC) PF 100 MG/5ML IV SOSY
PREFILLED_SYRINGE | INTRAVENOUS | Status: DC | PRN
  Administered 2021-10-01: 80 mg via INTRAVENOUS

## 2021-10-01 MED ORDER — ONDANSETRON HCL 4 MG/2ML IJ SOLN
INTRAMUSCULAR | Status: DC | PRN
  Administered 2021-10-01: 4 mg via INTRAVENOUS

## 2021-10-01 MED ORDER — CEFAZOLIN SODIUM 1 G IJ SOLR
INTRAMUSCULAR | Status: AC
  Filled 2021-10-01: qty 20

## 2021-10-01 MED ORDER — RISPERIDONE 0.5 MG PO TABS
0.5000 mg | ORAL_TABLET | Freq: Two times a day (BID) | ORAL | Status: DC
  Administered 2021-10-01 – 2021-10-03 (×3): 0.5 mg via ORAL
  Filled 2021-10-01 (×6): qty 1

## 2021-10-01 MED ORDER — ACETAMINOPHEN 10 MG/ML IV SOLN: 1000.0000 mg | Freq: Once | INTRAVENOUS | Status: DC | PRN

## 2021-10-01 MED ORDER — HYDROMORPHONE HCL 1 MG/ML IJ SOLN: 0.5000 mg | INTRAMUSCULAR | Status: DC | PRN

## 2021-10-01 MED ORDER — APIXABAN 2.5 MG PO TABS
2.5000 mg | ORAL_TABLET | Freq: Two times a day (BID) | ORAL | Status: DC
Start: 2021-10-01 — End: 2021-10-01
  Administered 2021-10-01: 2.5 mg via ORAL
  Filled 2021-10-01: qty 1

## 2021-10-01 MED ORDER — CEFAZOLIN SODIUM-DEXTROSE 2-3 GM-%(50ML) IV SOLR
INTRAVENOUS | Status: DC | PRN
  Administered 2021-10-01: 2 g via INTRAVENOUS

## 2021-10-01 MED ORDER — ADULT MULTIVITAMIN W/MINERALS CH
1.0000 | ORAL_TABLET | Freq: Every day | ORAL | Status: DC
  Administered 2021-10-02 – 2021-10-03 (×2): 1 via ORAL
  Filled 2021-10-01 (×2): qty 1

## 2021-10-01 MED ORDER — FENOFIBRATE 54 MG PO TABS
54.0000 mg | ORAL_TABLET | Freq: Every day | ORAL | Status: DC
  Administered 2021-10-02 – 2021-10-03 (×2): 54 mg via ORAL
  Filled 2021-10-01 (×3): qty 1

## 2021-10-01 MED ORDER — PROPOFOL 10 MG/ML IV BOLUS
INTRAVENOUS | Status: AC
  Filled 2021-10-01: qty 20

## 2021-10-01 MED ORDER — ONE-DAILY MULTI VITAMINS PO TABS: 1.0000 | ORAL_TABLET | Freq: Every day | ORAL | Status: DC

## 2021-10-01 MED ORDER — GLYCOPYRROLATE 0.2 MG/ML IJ SOLN
INTRAMUSCULAR | Status: AC
Start: 2021-10-01 — End: ?
  Filled 2021-10-01: qty 1

## 2021-10-01 MED ORDER — ONDANSETRON HCL 4 MG/2ML IJ SOLN: 4.0000 mg | Freq: Once | INTRAMUSCULAR | Status: DC | PRN

## 2021-10-01 MED ORDER — ENSURE ENLIVE PO LIQD
237.0000 mL | Freq: Two times a day (BID) | ORAL | Status: DC
  Administered 2021-10-02 – 2021-10-03 (×3): 237 mL via ORAL

## 2021-10-01 MED ORDER — TRAMADOL HCL 50 MG PO TABS
50.0000 mg | ORAL_TABLET | Freq: Four times a day (QID) | ORAL | Status: DC | PRN
  Administered 2021-10-02 – 2021-10-03 (×3): 50 mg via ORAL
  Filled 2021-10-01 (×4): qty 1

## 2021-10-01 MED ORDER — DEXAMETHASONE SODIUM PHOSPHATE 10 MG/ML IJ SOLN
INTRAMUSCULAR | Status: DC | PRN
Start: 2021-10-01 — End: 2021-10-01
  Administered 2021-10-01: 5 mg via INTRAVENOUS

## 2021-10-01 SURGICAL SUPPLY — 35 items
BLADE SURG SZ10 CARB STEEL (BLADE) ×3 IMPLANT
BNDG COHESIVE 6X5 TAN ST LF (GAUZE/BANDAGES/DRESSINGS) ×6 IMPLANT
DRAPE SURG 17X11 SM STRL (DRAPES) ×6 IMPLANT
DRAPE U-SHAPE 47X51 STRL (DRAPES) ×3 IMPLANT
DRSG OPSITE POSTOP 3X4 (GAUZE/BANDAGES/DRESSINGS) IMPLANT
DRSG OPSITE POSTOP 4X6 (GAUZE/BANDAGES/DRESSINGS) IMPLANT
DURAPREP 26ML APPLICATOR (WOUND CARE) ×6 IMPLANT
ELECT REM PT RETURN 9FT ADLT (ELECTROSURGICAL) ×3
ELECTRODE REM PT RTRN 9FT ADLT (ELECTROSURGICAL) ×1 IMPLANT
GAUZE SPONGE 4X4 12PLY STRL (GAUZE/BANDAGES/DRESSINGS) ×3 IMPLANT
GLOVE SURG ORTHO LTX SZ9 (GLOVE) ×6 IMPLANT
GLOVE SURG UNDER POLY LF SZ9 (GLOVE) ×3 IMPLANT
GOWN STRL REUS TWL 2XL XL LVL4 (GOWN DISPOSABLE) ×3 IMPLANT
GOWN STRL REUS W/ TWL LRG LVL3 (GOWN DISPOSABLE) ×1 IMPLANT
GOWN STRL REUS W/TWL LRG LVL3 (GOWN DISPOSABLE) ×2
GUIDEWIRE THREADED 2.8 (WIRE) ×6 IMPLANT
HOLDER FOLEY CATH W/STRAP (MISCELLANEOUS) IMPLANT
MANIFOLD NEPTUNE II (INSTRUMENTS) ×3 IMPLANT
MAT ABSORB  FLUID 56X50 GRAY (MISCELLANEOUS) ×2
MAT ABSORB FLUID 56X50 GRAY (MISCELLANEOUS) ×1 IMPLANT
NS IRRIG 1000ML POUR BTL (IV SOLUTION) ×3 IMPLANT
PACK HIP COMPR (MISCELLANEOUS) ×3 IMPLANT
SCREW CANN 16 THRD/85 6.5 (Screw) ×4 IMPLANT
SCREW CANN 16 THRD/90 6.5 (Screw) ×4 IMPLANT
SCREW CANN 16 THRD/90 7.3 (Screw) ×2 IMPLANT
SCREW CANN 32 THRD/110 7.3 (Screw) ×2 IMPLANT
SCREW CANN 32 THRD/90 7.3 (Screw) ×2 IMPLANT
STAPLER SKIN PROX 35W (STAPLE) ×3 IMPLANT
STRAP SAFETY 5IN WIDE (MISCELLANEOUS) ×3 IMPLANT
SUT VIC AB 0 CT1 36 (SUTURE) ×3 IMPLANT
SUT VIC AB 2-0 CT2 27 (SUTURE) ×3 IMPLANT
SUT VICRYL 0 AB UR-6 (SUTURE) ×6 IMPLANT
TRAY FOLEY MTR SLVR 16FR STAT (SET/KITS/TRAYS/PACK) IMPLANT
WASHER FOR 5.0 SCREWS (Washer) ×6 IMPLANT
WATER STERILE IRR 500ML POUR (IV SOLUTION) ×3 IMPLANT
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
## 2021-10-01 NOTE — Brief Op Note (Signed)
10/01/2021 ? ?8:30 PM ? ?PATIENT:  Donna Adkins  76 y.o. female ? ?PRE-OPERATIVE DIAGNOSIS:  hip fx ? ?POST-OPERATIVE DIAGNOSIS:  right hip fracture  ? ?PROCEDURE:  Procedure(s): ?CANNULATED HIP PINNING (Right) ? ?SURGEON:  Surgeon(s) and Role: ?   * Braydn Carneiro, Osvaldo Human, MD - Primary ? ?PHYSICIAN ASSISTANT:  ? ?ASSISTANTS: none  ? ?ANESTHESIA:   general ? ?EBL:  50 mL  ? ?BLOOD ADMINISTERED:none ? ?DRAINS: none  ? ?LOCAL MEDICATIONS USED:  NONE ? ?SPECIMEN:  No Specimen ? ?DISPOSITION OF SPECIMEN:  N/A ? ?COUNTS:  Correct.  ? ?TOURNIQUET:  * No tourniquets in log * ? ?DICTATION: .Note written in EPIC ? ?PLAN OF CARE: Admit to inpatient  ? ?PATIENT DISPOSITION:  PACU - hemodynamically stable. ?  ?Delay start of Pharmacological VTE agent (>24hrs) due to surgical blood loss or risk of bleeding: no ? ?

## 2021-10-01 NOTE — Assessment & Plan Note (Signed)
Chronic.  Patient lives in a nursing home.  Full CODE STATUS. ?

## 2021-10-01 NOTE — Plan of Care (Signed)

## 2021-10-01 NOTE — Assessment & Plan Note (Signed)
Stable

## 2021-10-01 NOTE — Assessment & Plan Note (Signed)
Admit to MedSurg bed.  Orthopedics consulted by EDP.  Continue n.p.o. status.  Continue pain medicine. ?

## 2021-10-01 NOTE — Progress Notes (Signed)
[facility]  -  at [facility] ? ? ?PATIENT NAME: Donna Adkins   ? ?MR#:  [id number] ? ?DATE OF BIRTH:  01-28-1946 ? ?SUBJECTIVE:  ?no family at bedside. Patient is a long-term resident at [facility] came in after right hip pain which started two days ago. She says she felt a pop after she was being turned in the bed for cleanup. X-rays showed right femoral neck fracture. ? ?Awaiting getting touch with DSS for consent for surgery ? ?VITALS:  ?Blood pressure 120/63, pulse 63, temperature 98.2 ?F (36.8 ?C), resp. rate 16, height 5\' 6"  (1.676 m), weight 73.9 kg, SpO2 96 %. ? ?PHYSICAL EXAMINATION:  ? ?GENERAL:  76 y.o.-year-old patient lying in the bed with no acute distress.  ?LUNGS: Normal breath sounds bilaterally, no wheezing, rales, rhonchi.  ?CARDIOVASCULAR: S1, S2 normal. No murmurs, rubs, or gallops.  ?ABDOMEN: Soft, nontender, nondistended. Bowel sounds present.  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 Decreased ROM right hip   ?NEUROLOGIC: nonfocal  patient is alert and awake ?SKIN: No obvious rash, lesion, or ulcer ? ?LABORATORY PANEL:  ?CBC ?Recent Labs  ?Lab 09/30/21 ?2320  ?WBC 6.8  ?HGB 10.8*  ?HCT 33.2*  ?PLT 191  ? ? ?Chemistries  ?Recent Labs  ?Lab 09/30/21 ?2320  ?NA 135  ?K 4.0  ?CL 104  ?CO2 26  ?GLUCOSE 144*  ?BUN 12  ?CREATININE 0.59  ?CALCIUM 9.4  ? ?Cardiac Enzymes ?No results for input(s): TROPONINI in the last 168 hours. ?RADIOLOGY:  ?DG Chest Portable 1 View ? ?Result Date: 10/01/2021 ?CLINICAL DATA:  Femoral neck fracture. EXAM: PORTABLE CHEST 1 VIEW COMPARISON:  None. FINDINGS: The patient is obliquely rotated to the left. The cardiac size is normal. There is no pneumothorax. There is calcification of the transverse aorta, unremarkable mediastinal configuration apart from slight aortic tortuosity. The lungs are slightly emphysematous but clear. No pleural effusion is evident. Osteopenia and mild thoracic  dextroscoliosis. No displaced rib fracture is visible. There are bilateral rim calcified breast implants. IMPRESSION: 1. No evidence of acute chest disease.  COPD. 2. Aortic atherosclerosis. 3. Osteopenia. Electronically Signed   By: 12/01/2021 M.D.   On: 10/01/2021 00:12  ? ?DG Hip Unilat W or Wo Pelvis 2-3 Views Right ? ?Result Date: 10/01/2021 ?CLINICAL DATA:  Initial evaluation for acute right leg pain. EXAM: DG HIP (WITH OR WITHOUT PELVIS) 2-3V RIGHT COMPARISON:  None available. FINDINGS: Severe osteopenia, limiting assessment for possible subtle acute nondisplaced fractures. There is a subacute to chronic fracture extending through the intertrochanteric left femoral neck with slight superior subluxation. Femoral head remains in normal alignment within the acetabulum. Subtle cortical step-off at the iliopectineal line suspicious for a subtle nondisplaced fracture, age indeterminate. Possible additional age indeterminate nondisplaced fracture through the superior left pubic ramus. Remainder of the bony pelvis grossly intact. Cortical step-off at the contralateral right femoral neck suspicious for an acute nondisplaced subcapital femoral neck fracture. Right femoral head remains in normal alignment within the acetabulum. Visualized soft tissues demonstrate no acute finding. Multiple scattered mildly prominent gas-filled loops of bowel noted overlying the mid and lower abdomen. IMPRESSION: 1. Cortical step-off at the right femoral neck, suspicious for an acute nondisplaced subcapital femoral neck fracture. Correlation with physical exam recommended. 2. Subacute to chronic fracture extending through the intertrochanteric left femoral neck. 3. Question additional subtle nondisplaced fractures involving the left iliopectineal line and left superior ramus, age indeterminate. 4. Severe osteopenia.  Electronically Signed   By: Donna Adkins M.D.   On: 10/01/2021 00:17   ? ?Assessment and Plan ?76 year old  African-American female with a history of multiple sclerosis, hypertension, history of DVT on Eliquis, hx of left hip fracture managed nonoperatively, lives at a local nursing home for the last 9 years, presents to the ER today with right hip pain.  Patient states that she felt a pop after she was being turned in bed ? ?X-rays showed a right femoral neck fracture. ? ?Close right hip fracture ?-- orthopedic surgery consultation with Dr.Nappo-- plans for surgery once consent obtained from DSS ?-- NPO ?-- IV fluids ?-- PRN pain meds ?-- patient is bedbound status ? ?chronic bedbound ?-- from [facility] ? ?history of multiple sclerosis ? ?chronic DVT of popliteal vein ?-- on eliquis-- will resume after surgery ? ? ? ? ?Procedures: ?Family communication :none ?Consults :Orthopedic ?CODE STATUS: Full ?DVT Prophylaxis :SCD ?Level of care: Med-Surg ?Status is: Inpatient ?Remains inpatient appropriate because: Hip fracture--pending surgery ?  ? ?TOTAL TIME TAKING CARE OF THIS PATIENT: 25 minutes.  ?>50% time spent on counselling and coordination of care ? ?Note: This dictation was prepared with Dragon dictation along with smaller phrase technology. Any transcriptional errors that result from this process are unintentional. ? ?Donna Adkins M.D  ? ? ?[facility]  ? ?CC: ?Primary care physician; Clovis Cao, MD  ?

## 2021-10-01 NOTE — Progress Notes (Signed)
Patient to OR

## 2021-10-01 NOTE — Subjective & Objective (Signed)
Chief complaint: Right hip pain ?History present illness: ?76 year old African-American female with a history of multiple sclerosis, hypertension, history of DVT on Eliquis, hx of left hip fracture managed nonoperatively, lives at a local nursing home for the last 9 years, presents to the ER today with right hip pain.  Patient states that she felt a pop after she was being turned in bed.  Patient brought to the ER for evaluation. ? ?Patient been bedbound for 9 years now. ? ?X-rays showed a right femoral neck fracture. ? ?Due to continued pain of her right hip, patient has agreed to right hip surgery now. ? ?EDP is discussed the case with orthopedics.  They feel confident they can manage the patient's fracture here.  They have elected to continue the patient's Eliquis. ? ?[facility] contacted for admission. ? ? ?

## 2021-10-01 NOTE — Anesthesia Preprocedure Evaluation (Signed)
Anesthesia Evaluation  ?Patient identified by MRN, date of birth, ID band ?Patient awake ? ?General Assessment Comment: ? ?AOx3, asking appropriate questions ? ?Reviewed: ?Allergy & Precautions, H&P , NPO status , Patient's Chart, lab work & pertinent test results ? ?History of Anesthesia Complications ?Negative for: history of anesthetic complications ? ?Airway ?Mallampati: II ? ?TM Distance: >3 FB ? ? ? ? Dental ? ?(+) Chipped, Poor Dentition, Missing ?  ?Pulmonary ?neg pulmonary ROS, neg sleep apnea, neg COPD, Patient abstained from smoking.Not current smoker,  ?  ? ?+ decreased breath sounds ? ? ? ? ? Cardiovascular ?Exercise Tolerance: Good ?METS(-) hypertension(-) angina+ DVT  ?(-) CAD, (-) Past MI and (-) Cardiac Stents (-) dysrhythmias  ?Rhythm:regular Rate:Normal ? ?On eliquis ?  ?Neuro/Psych ?Multiple sclerosis, well controlled no relapses. She is chronically bedbound. Has not had issues with her MS postoperatively before  ? Neuromuscular disease negative psych ROS  ? GI/Hepatic ?negative GI ROS, Neg liver ROS, neg GERD  ,  ?Endo/Other  ?negative endocrine ROSneg diabetes ? Renal/GU ?negative Renal ROS  ?negative genitourinary ?  ?Musculoskeletal ? ? Abdominal ?  ?Peds ? Hematology ?negative hematology ROS ?(+)   ?Anesthesia Other Findings ?Past Medical History: ?No date: Left foot burn ?No date: Multiple sclerosis (HCC) ? ?Past Surgical History: ?06/21/2020: COLONOSCOPY WITH PROPOFOL; N/A ?    Comment:  Procedure: COLONOSCOPY WITH PROPOFOL;  Surgeon: [place],  ?             Boykin Nearing, MD;  Location: [facility] ENDOSCOPY;  Service:  ?             Gastroenterology;  Laterality: N/A; ?No date: full thickness graft of leg ? ?BMI   ? Body Mass Index: 27.44 kg/m?  ?  ? ? Reproductive/Obstetrics ?negative OB ROS ? ?  ? ? ? ? ? ? ? ? ? ? ? ? ? ?  ?  ? ? ? ? ? ? ? ? ?Anesthesia Physical ? ?Anesthesia Plan ? ?ASA: 3 ? ?Anesthesia Plan: General  ? ?Post-op Pain Management: Ofirmev IV  (intra-op)* and Dilaudid IV  ? ?Induction: Intravenous ? ?PONV Risk Score and Plan: 4 or greater and Ondansetron, Dexamethasone and Treatment may vary due to age or medical condition ? ?Airway Management Planned: Oral ETT ? ?Additional Equipment: None ? ?Intra-op Plan:  ? ?Post-operative Plan: Extubation in OR ? ?Informed Consent: I have reviewed the patients History and Physical, chart, labs and discussed the procedure including the risks, benefits and alternatives for the proposed anesthesia with the patient or authorized representative who has indicated his/her understanding and acceptance.  ? ? ? ?Dental Advisory Given and Consent reviewed with POA ? ?Plan Discussed with: Anesthesiologist, CRNA and Surgeon ? ?Anesthesia Plan Comments: (Discussed risks of anesthesia with patient, including PONV, sore throat, lip/dental/eye damage. Rare risks discussed as well, such as cardiorespiratory and neurological sequelae, and allergic reactions. Discussed the role of CRNA in patient's perioperative care. Patient understands. ? ?Patient is a ward of the state. I obtained official consent from [facility] DSS by phone in presence of nurse witness (see paper consent).)  ? ? ? ? ? ? ?Anesthesia Quick Evaluation ? ?

## 2021-10-01 NOTE — Anesthesia Procedure Notes (Signed)
Procedure Name: LMA Insertion ?Date/Time: 10/01/2021 6:49 PM ?Performed by: Jeronimo Norma, CRNA ?Pre-anesthesia Checklist: Patient identified, Emergency Drugs available, Patient being monitored, Suction available and Timeout performed ?Patient Re-evaluated:Patient Re-evaluated prior to induction ?Oxygen Delivery Method: Circle system utilized ?Preoxygenation: Pre-oxygenation with 100% oxygen ?Induction Type: IV induction ?LMA: LMA inserted ?LMA Size: 4.0 ?Number of attempts: 2 ?Tube secured with: Tape ?Dental Injury: Teeth and Oropharynx as per pre-operative assessment  ?Comments: First attempt with LMA 3 with poor seal. Second attempt with LMA 4 successful ? ? ? ? ?

## 2021-10-01 NOTE — Assessment & Plan Note (Signed)
-   Continue Eliquis 

## 2021-10-01 NOTE — Assessment & Plan Note (Signed)
Chronic. 

## 2021-10-01 NOTE — Transfer of Care (Signed)
Immediate Anesthesia Transfer of Care Note ? ?Patient: Donna Adkins ? ?Procedure(s) Performed: CANNULATED HIP PINNING (Right: Hip) ? ?Patient Location: PACU ? ?Anesthesia Type:General ? ?Level of Consciousness: awake and patient cooperative ? ?Airway & Oxygen Therapy: Patient Spontanous Breathing and Patient connected to nasal cannula oxygen ? ?Post-op Assessment: Report given to RN and Post -op Vital signs reviewed and stable ? ?Post vital signs: Reviewed and stable ? ?Last Vitals:  ?Vitals Value Taken Time  ?BP 103/61 10/01/21 2023  ?Temp    ?Pulse 80 10/01/21 2026  ?Resp 14 10/01/21 2026  ?SpO2 86 % 10/01/21 2026  ?Vitals shown include unvalidated device data. ? ?Last Pain:  ?Vitals:  ? 10/01/21 1108  ?TempSrc:   ?PainSc: 6   ?   ? ?  ? ?Complications: No notable events documented. ?

## 2021-10-01 NOTE — OR Nursing (Signed)
Second attempt to contact DSS  social worker on call in regards to consent for surgery. ?

## 2021-10-01 NOTE — OR Nursing (Signed)
[employer] department contacted for DSS social worker on call. Pt is listed as a ward of the state and will need consent for her procedure. Waiting on return call. ?

## 2021-10-01 NOTE — Progress Notes (Signed)
Initial Nutrition Assessment ? ?DOCUMENTATION CODES:  ? ?Not applicable ? ?INTERVENTION:  ? ?Ensure Enlive po BID, each supplement provides 350 kcal and 20 grams of protein. ? ?MVI po daily  ? ?NUTRITION DIAGNOSIS:  ? ?Increased nutrient needs related to hip fracture as evidenced by estimated needs. ? ?GOAL:  ? ?Patient will meet greater than or equal to 90% of their needs ? ?MONITOR:  ? ?PO intake, Supplement acceptance, Labs, Weight trends, Skin, I & O's ? ?REASON FOR ASSESSMENT:  ? ?Consult ?Hip fracture protocol ? ?ASSESSMENT:  ? ?76 y/o female with h/o MS, bedbound resides at [facility], HTN, HLD and DVT who is admitted with hip fracture. ? ?RD working remotely. ? ?Unable to speak with pt via phone. Pt currently NPO for hip fracture repair. RD will add supplements and MVI to help pt meet her estimated needs. Per chart, pt is down 7lbs(4%) since July; RD unsure how recently wt loss occurred. RD will obtain nutrition related history and exam and follow-up.   ? ?Medications reviewed and include: LRS @50ml /hr ? ?Labs reviewed: K 4.0 wnl ?Hgb 10.8(L), Hct 33.2(L) ? ?NUTRITION - FOCUSED PHYSICAL EXAM: ?Unable to perform at this time  ? ?Diet Order:   ?Diet Order   ? ?       ?  Diet NPO time specified  Diet effective now       ?  ? ?  ?  ? ?  ? ?EDUCATION NEEDS:  ? ?Not appropriate for education at this time ? ?Skin:  Skin Assessment: Reviewed RN Assessment ? ?Last BM:  PTA ? ?Height:  ? ?Ht Readings from Last 1 Encounters:  ?10/01/21 5\' 6"  (1.676 m)  ? ? ?Weight:  ? ?Wt Readings from Last 1 Encounters:  ?10/01/21 73.9 kg  ? ? ?Ideal Body Weight:  59 kg ? ?BMI:  Body mass index is 26.29 kg/m?. ? ?Estimated Nutritional Needs:  ? ?Kcal:  1500-1700kcal/day ? ?Protein:  75-85g/day ? ?Fluid:  1.5-1.7L/day ? ? MS, RD, LDN ?Please refer to AMION for RD and/or RD on-call/weekend/after hours pager ? ?

## 2021-10-02 DIAGNOSIS — S72001A Fracture of unspecified part of neck of right femur, initial encounter for closed fracture: Secondary | ICD-10-CM | POA: Diagnosis not present

## 2021-10-02 LAB — CBC WITH DIFFERENTIAL/PLATELET
Abs Immature Granulocytes: 0.08 10*3/uL — ABNORMAL HIGH (ref 0.00–0.07)
Basophils Absolute: 0 10*3/uL (ref 0.0–0.1)
Basophils Relative: 0 %
Eosinophils Absolute: 0 10*3/uL (ref 0.0–0.5)
Eosinophils Relative: 0 %
HCT: 26.4 % — ABNORMAL LOW (ref 36.0–46.0)
Hemoglobin: 8.7 g/dL — ABNORMAL LOW (ref 12.0–15.0)
Immature Granulocytes: 1 %
Lymphocytes Relative: 5 %
Lymphs Abs: 0.7 10*3/uL (ref 0.7–4.0)
MCH: 31.2 pg (ref 26.0–34.0)
MCHC: 33 g/dL (ref 30.0–36.0)
MCV: 94.6 fL (ref 80.0–100.0)
Monocytes Absolute: 0.5 10*3/uL (ref 0.1–1.0)
Monocytes Relative: 3 %
Neutro Abs: 12.3 10*3/uL — ABNORMAL HIGH (ref 1.7–7.7)
Neutrophils Relative %: 91 %
Platelets: 187 10*3/uL (ref 150–400)
RBC: 2.79 MIL/uL — ABNORMAL LOW (ref 3.87–5.11)
RDW: 14.9 % (ref 11.5–15.5)
WBC: 13.6 10*3/uL — ABNORMAL HIGH (ref 4.0–10.5)
nRBC: 0 % (ref 0.0–0.2)

## 2021-10-02 LAB — GLUCOSE, CAPILLARY: Glucose-Capillary: 160 mg/dL — ABNORMAL HIGH (ref 70–99)

## 2021-10-02 LAB — MAGNESIUM: Magnesium: 1.6 mg/dL — ABNORMAL LOW (ref 1.7–2.4)

## 2021-10-02 LAB — COMPREHENSIVE METABOLIC PANEL
ALT: 12 U/L (ref 0–44)
AST: 23 U/L (ref 15–41)
Albumin: 2.1 g/dL — ABNORMAL LOW (ref 3.5–5.0)
Alkaline Phosphatase: 43 U/L (ref 38–126)
Anion gap: 12 (ref 5–15)
BUN: 13 mg/dL (ref 8–23)
CO2: 19 mmol/L — ABNORMAL LOW (ref 22–32)
Calcium: 8.8 mg/dL — ABNORMAL LOW (ref 8.9–10.3)
Chloride: 104 mmol/L (ref 98–111)
Creatinine, Ser: 0.6 mg/dL (ref 0.44–1.00)
GFR, Estimated: >60 mL/min (ref >60)
Glucose, Bld: 160 mg/dL — ABNORMAL HIGH (ref 70–99)
Potassium: 4.4 mmol/L (ref 3.5–5.1)
Sodium: 135 mmol/L (ref 135–145)
Total Bilirubin: 0.9 mg/dL (ref 0.3–1.2)
Total Protein: 5.3 g/dL — ABNORMAL LOW (ref 6.5–8.1)

## 2021-10-02 LAB — PREPARE RBC (CROSSMATCH)

## 2021-10-02 LAB — HEMOGLOBIN AND HEMATOCRIT, BLOOD
HCT: 30.4 % — ABNORMAL LOW (ref 36.0–46.0)
Hemoglobin: 10.2 g/dL — ABNORMAL LOW (ref 12.0–15.0)

## 2021-10-02 MED ORDER — SODIUM CHLORIDE 0.9 % IV BOLUS
500.0000 mL | Freq: Once | INTRAVENOUS | Status: AC
  Administered 2021-10-02: 500 mL via INTRAVENOUS

## 2021-10-02 MED ORDER — MAGNESIUM SULFATE 4 GM/100ML IV SOLN
4.0000 g | Freq: Once | INTRAVENOUS | Status: AC
  Administered 2021-10-02: 4 g via INTRAVENOUS
  Filled 2021-10-02: qty 100

## 2021-10-02 MED ORDER — SODIUM CHLORIDE 0.9% IV SOLUTION: Freq: Once | INTRAVENOUS | Status: AC

## 2021-10-02 NOTE — Progress Notes (Addendum)
[place] at [facility] ? ? ?PATIENT NAME: Donna Adkins   ? ?MR#:  [id number] ? ?DATE OF BIRTH:  1945/08/29 ? ?SUBJECTIVE:  ?no family at bedside. Patient is a long-term resident at [facility] came in after right hip pain which started two days ago. She says she felt a pop after she was being turned in the bed for cleanup. X-rays showed right femoral neck fracture. ? ?POD #1  s/p BT hgb 10.4 today ?BP soft ? ?VITALS:  ?Blood pressure 96/61, pulse 89, temperature 98.3 ?F (36.8 ?C), resp. rate 17, height 5\' 6"  (1.676 m), weight 73.9 kg, SpO2 96 %. ? ?PHYSICAL EXAMINATION:  ? ?GENERAL:  76 y.o.-year-old patient lying in the bed with no acute distress.  ?LUNGS: Normal breath sounds bilaterally, no wheezing, rales, rhonchi.  ?CARDIOVASCULAR: S1, S2 normal. No murmurs, rubs, or gallops.  ?ABDOMEN: Soft, nontender, nondistended. Bowel sounds present.  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 Decreased ROM right hip   ?NEUROLOGIC: nonfocal  patient is alert and awake ?SKIN: No obvious rash, lesion, or ulcer ? ?LABORATORY PANEL:  ?CBC ?Recent Labs  ?Lab 10/02/21 ?0329 10/02/21 ?1034  ?WBC 13.6*  --   ?HGB 8.7* 10.2*  ?HCT 26.4* 30.4*  ?PLT 187  --   ? ? ? ?Chemistries  ?Recent Labs  ?Lab 10/02/21 ?0329  ?NA 135  ?K 4.4  ?CL 104  ?CO2 19*  ?GLUCOSE 160*  ?BUN 13  ?CREATININE 0.60  ?CALCIUM 8.8*  ?MG 1.6*  ?AST 23  ?ALT 12  ?ALKPHOS 43  ?BILITOT 0.9  ? ? ?Cardiac Enzymes ?No results for input(s): TROPONINI in the last 168 hours. ?RADIOLOGY:  ?DG Chest Portable 1 View ? ?Result Date: 10/01/2021 ?CLINICAL DATA:  Femoral neck fracture. EXAM: PORTABLE CHEST 1 VIEW COMPARISON:  None. FINDINGS: The patient is obliquely rotated to the left. The cardiac size is normal. There is no pneumothorax. There is calcification of the transverse aorta, unremarkable mediastinal configuration apart from slight aortic tortuosity. The lungs are slightly emphysematous  but clear. No pleural effusion is evident. Osteopenia and mild thoracic dextroscoliosis. No displaced rib fracture is visible. There are bilateral rim calcified breast implants. IMPRESSION: 1. No evidence of acute chest disease.  COPD. 2. Aortic atherosclerosis. 3. Osteopenia. Electronically Signed   By: Telford Nab M.D.   On: 10/01/2021 00:12  ? ?DG C-Arm 1-60 Min-No Report ? ?Result Date: 10/01/2021 ?Fluoroscopy was utilized by the requesting physician.  No radiographic interpretation.  ? ?DG C-Arm 1-60 Min-No Report ? ?Result Date: 10/01/2021 ?Fluoroscopy was utilized by the requesting physician.  No radiographic interpretation.  ? ?DG HIP UNILAT WITH PELVIS 2-3 VIEWS RIGHT ? ?Result Date: 10/01/2021 ?CLINICAL DATA:  Fracture, internal fixation EXAM: DG HIP (WITH OR WITHOUT PELVIS) 2-3V RIGHT COMPARISON:  09/30/2021 FINDINGS: Multiple intraoperative spot images demonstrate internal fixation across the right femoral neck fracture. No hardware complicating feature. Anatomic alignment. IMPRESSION: Internal fixation.  No complicating feature. Electronically Signed   By: Rolm Baptise M.D.   On: 10/01/2021 20:48  ? ?DG Hip Unilat W or Wo Pelvis 2-3 Views Right ? ?Result Date: 10/01/2021 ?CLINICAL DATA:  Initial evaluation for acute right leg pain. EXAM: DG HIP (WITH OR WITHOUT PELVIS) 2-3V RIGHT COMPARISON:  None available. FINDINGS: Severe osteopenia, limiting assessment for possible subtle acute nondisplaced fractures. There is a subacute to chronic fracture extending through the intertrochanteric left femoral neck with slight superior subluxation. Femoral head  remains in normal alignment within the acetabulum. Subtle cortical step-off at the iliopectineal line suspicious for a subtle nondisplaced fracture, age indeterminate. Possible additional age indeterminate nondisplaced fracture through the superior left pubic ramus. Remainder of the bony pelvis grossly intact. Cortical step-off at the contralateral right  femoral neck suspicious for an acute nondisplaced subcapital femoral neck fracture. Right femoral head remains in normal alignment within the acetabulum. Visualized soft tissues demonstrate no acute finding. Multiple scattered mildly prominent gas-filled loops of bowel noted overlying the mid and lower abdomen. IMPRESSION: 1. Cortical step-off at the right femoral neck, suspicious for an acute nondisplaced subcapital femoral neck fracture. Correlation with physical exam recommended. 2. Subacute to chronic fracture extending through the intertrochanteric left femoral neck. 3. Question additional subtle nondisplaced fractures involving the left iliopectineal line and left superior ramus, age indeterminate. 4. Severe osteopenia. Electronically Signed   By: Jeannine Boga M.D.   On: 10/01/2021 00:17   ? ?Assessment and Plan ?76 year old African-American female with a history of multiple sclerosis, hypertension, history of DVT on Eliquis, hx of left hip fracture managed nonoperatively, lives at a local nursing home for the last 9 years, presents to the ER today with right hip pain.  Patient states that she felt a pop after she was being turned in bed ? ?X-rays showed a right femoral neck fracture. ? ?Close right hip fracture, no fall ?-- orthopedic surgery consultation with Dr.Nappo/bowers ?-- IV fluids ?-- PRN pain meds ?-- patient is bedbound status ?--POD#1 received 1 unit BT in the setting of post op hypotension. ?Hgb 10.4 ? ?chronic bedbound ?-- from [facility] ? ?history of multiple sclerosis ? ?chronic DVT of popliteal vein ?-- on eliquis-- will resume after surgery when ok with ortho ? ? ? ? ?Procedures:Right hip percutaneous screw fixation ?Family communication :none ?Consults :Orthopedic ?CODE STATUS: Full ?DVT Prophylaxis :SCD ?Level of care: Med-Surg ?Status is: Inpatient ?Remains inpatient appropriate because: Hip fracture--pending surgery ?  ? ?Pt will return to [facility] in 1-2 days ? ? ?TOTAL TIME  TAKING CARE OF THIS PATIENT: 25 minutes.  ?>50% time spent on counselling and coordination of care ? ?Note: This dictation was prepared with Dragon dictation along with smaller phrase technology. Any transcriptional errors that result from this process are unintentional. ? ?Fritzi Mandes M.D  ? ? ?[facility]  ? ?CC: ?Primary care physician; Gennie Alma, MD  ?

## 2021-10-02 NOTE — Plan of Care (Signed)

## 2021-10-02 NOTE — Progress Notes (Signed)
Cross Cover ?S/P hip fx repair in setting of eliquis therapy - Large volume of blood from surgical site. Surgeon made aware by nursing and instructed to reinforce the surgical site.   Patient hypotensive pale and tachycardic ?Stat CBC shows 2 gm drop of HGB in less than 24 hours,  ?1 unit packed red blood cell transfusion ordered after discussion risks and benefits with patient ? ?

## 2021-10-02 NOTE — Progress Notes (Signed)
?Subjective: ? ?Patient reports pain as mild.   ? ?Objective:  ? ?VITALS:   ?Vitals:  ? 10/02/21 0515 10/02/21 2979 10/02/21 0807 10/02/21 0842  ?BP: (!) 83/60 (!) 87/58 90/62 96/61   ?Pulse: (!) 101 96 97 89  ?Resp: 18 16 18 17   ?Temp: 97.7 ?F (36.5 ?C) 97.6 ?F (36.4 ?C) 98.4 ?F (36.9 ?C) 98.3 ?F (36.8 ?C)  ?TempSrc: Oral Oral Oral   ?SpO2: 96% 96% 96% 96%  ?Weight:      ?Height:      ? ? ?PHYSICAL EXAM: ? ?No cellulitis present ?Compartment soft ?Dressing is clean and dry ? ?LABS ? ?Results for orders placed or performed during the hospital encounter of 09/30/21 (from the past 24 hour(s))  ?Glucose, capillary     Status: Abnormal  ? Collection Time: 10/02/21  3:12 AM  ?Result Value Ref Range  ? Glucose-Capillary 160 (H) 70 - 99 mg/dL  ?CBC with Differential/Platelet     Status: Abnormal  ? Collection Time: 10/02/21  3:29 AM  ?Result Value Ref Range  ? WBC 13.6 (H) 4.0 - 10.5 K/uL  ? RBC 2.79 (L) 3.87 - 5.11 MIL/uL  ? Hemoglobin 8.7 (L) 12.0 - 15.0 g/dL  ? HCT 26.4 (L) 36.0 - 46.0 %  ? MCV 94.6 80.0 - 100.0 fL  ? MCH 31.2 26.0 - 34.0 pg  ? MCHC 33.0 30.0 - 36.0 g/dL  ? RDW 14.9 11.5 - 15.5 %  ? Platelets 187 150 - 400 K/uL  ? nRBC 0.0 0.0 - 0.2 %  ? Neutrophils Relative % 91 %  ? Neutro Abs 12.3 (H) 1.7 - 7.7 K/uL  ? Lymphocytes Relative 5 %  ? Lymphs Abs 0.7 0.7 - 4.0 K/uL  ? Monocytes Relative 3 %  ? Monocytes Absolute 0.5 0.1 - 1.0 K/uL  ? Eosinophils Relative 0 %  ? Eosinophils Absolute 0.0 0.0 - 0.5 K/uL  ? Basophils Relative 0 %  ? Basophils Absolute 0.0 0.0 - 0.1 K/uL  ? Immature Granulocytes 1 %  ? Abs Immature Granulocytes 0.08 (H) 0.00 - 0.07 K/uL  ?Comprehensive metabolic panel     Status: Abnormal  ? Collection Time: 10/02/21  3:29 AM  ?Result Value Ref Range  ? Sodium 135 135 - 145 mmol/L  ? Potassium 4.4 3.5 - 5.1 mmol/L  ? Chloride 104 98 - 111 mmol/L  ? CO2 19 (L) 22 - 32 mmol/L  ? Glucose, Bld 160 (H) 70 - 99 mg/dL  ? BUN 13 8 - 23 mg/dL  ? Creatinine, Ser 0.60 0.44 - 1.00 mg/dL  ? Calcium 8.8 (L)  8.9 - 10.3 mg/dL  ? Total Protein 5.3 (L) 6.5 - 8.1 g/dL  ? Albumin 2.1 (L) 3.5 - 5.0 g/dL  ? AST 23 15 - 41 U/L  ? ALT 12 0 - 44 U/L  ? Alkaline Phosphatase 43 38 - 126 U/L  ? Total Bilirubin 0.9 0.3 - 1.2 mg/dL  ? GFR, Estimated >60 >60 mL/min  ? Anion gap 12 5 - 15  ?Magnesium     Status: Abnormal  ? Collection Time: 10/02/21  3:29 AM  ?Result Value Ref Range  ? Magnesium 1.6 (L) 1.7 - 2.4 mg/dL  ?ABO/Rh     Status: None  ? Collection Time: 10/02/21  3:29 AM  ?Result Value Ref Range  ? ABO/RH(D)    ?  A NEG ?Performed at [facility], [address] [place], [address] [place] ?  ?Prepare RBC (crossmatch)  Status: None  ? Collection Time: 10/02/21  4:30 AM  ?Result Value Ref Range  ? Order Confirmation    ?  ORDER PROCESSED BY BLOOD BANK ?Performed at [facility], [address], [place], [place] [address] ?  ?Hemoglobin and hematocrit, blood     Status: Abnormal  ? Collection Time: 10/02/21 10:34 AM  ?Result Value Ref Range  ? Hemoglobin 10.2 (L) 12.0 - 15.0 g/dL  ? HCT 30.4 (L) 36.0 - 46.0 %  ? ? ?DG Chest Portable 1 View ? ?Result Date: 10/01/2021 ?CLINICAL DATA:  Femoral neck fracture. EXAM: PORTABLE CHEST 1 VIEW COMPARISON:  None. FINDINGS: The patient is obliquely rotated to the left. The cardiac size is normal. There is no pneumothorax. There is calcification of the transverse aorta, unremarkable mediastinal configuration apart from slight aortic tortuosity. The lungs are slightly emphysematous but clear. No pleural effusion is evident. Osteopenia and mild thoracic dextroscoliosis. No displaced rib fracture is visible. There are bilateral rim calcified breast implants. IMPRESSION: 1. No evidence of acute chest disease.  COPD. 2. Aortic atherosclerosis. 3. Osteopenia. Electronically Signed   By: Almira Bar M.D.   On: 10/01/2021 00:12  ? ?DG C-Arm 1-60 Min-No Report ? ?Result Date: 10/01/2021 ?Fluoroscopy was utilized by the requesting physician.  No radiographic interpretation.   ? ?DG C-Arm 1-60 Min-No Report ? ?Result Date: 10/01/2021 ?Fluoroscopy was utilized by the requesting physician.  No radiographic interpretation.  ? ?DG HIP UNILAT WITH PELVIS 2-3 VIEWS RIGHT ? ?Result Date: 10/01/2021 ?CLINICAL DATA:  Fracture, internal fixation EXAM: DG HIP (WITH OR WITHOUT PELVIS) 2-3V RIGHT COMPARISON:  09/30/2021 FINDINGS: Multiple intraoperative spot images demonstrate internal fixation across the right femoral neck fracture. No hardware complicating feature. Anatomic alignment. IMPRESSION: Internal fixation.  No complicating feature. Electronically Signed   By: Charlett Nose M.D.   On: 10/01/2021 20:48  ? ?DG Hip Unilat W or Wo Pelvis 2-3 Views Right ? ?Result Date: 10/01/2021 ?CLINICAL DATA:  Initial evaluation for acute right leg pain. EXAM: DG HIP (WITH OR WITHOUT PELVIS) 2-3V RIGHT COMPARISON:  None available. FINDINGS: Severe osteopenia, limiting assessment for possible subtle acute nondisplaced fractures. There is a subacute to chronic fracture extending through the intertrochanteric left femoral neck with slight superior subluxation. Femoral head remains in normal alignment within the acetabulum. Subtle cortical step-off at the iliopectineal line suspicious for a subtle nondisplaced fracture, age indeterminate. Possible additional age indeterminate nondisplaced fracture through the superior left pubic ramus. Remainder of the bony pelvis grossly intact. Cortical step-off at the contralateral right femoral neck suspicious for an acute nondisplaced subcapital femoral neck fracture. Right femoral head remains in normal alignment within the acetabulum. Visualized soft tissues demonstrate no acute finding. Multiple scattered mildly prominent gas-filled loops of bowel noted overlying the mid and lower abdomen. IMPRESSION: 1. Cortical step-off at the right femoral neck, suspicious for an acute nondisplaced subcapital femoral neck fracture. Correlation with physical exam recommended. 2. Subacute  to chronic fracture extending through the intertrochanteric left femoral neck. 3. Question additional subtle nondisplaced fractures involving the left iliopectineal line and left superior ramus, age indeterminate. 4. Severe osteopenia. Electronically Signed   By: [name] M.D.   On: 10/01/2021 00:17   ? ?Assessment/Plan: ?1 Day Post-Op  ? ?Principal Problem: ?  Closed right hip fracture, initial encounter (HCC) ?Active Problems: ?  Benign essential HTN ?  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  Multiple sclerosis (HCC) ?  Bedbound ? ? ?Discharge to SNF  when medically stable ?She is non-weight bearing bilateral lower extremities, OK to log roll for hygiene purposes ?Would not recommend restart of Eliquis until the Hgb/Hct is checked in the AM ?Will check dressing again tomorrow ?Please call with questions ?RTC 2-3 weeks for staple removal with Altamese Cabal, PA-C ? ? ?Lyndle Herrlich , MD ?10/02/2021, 4:06 PM ? ? ? ? ? ? ?

## 2021-10-02 NOTE — TOC Initial Note (Signed)
Transition of Care (TOC) - Initial/Assessment Note  ? ? ?Patient Details  ?Name: Donna Adkins ?MRN: [id number] ?Date of Birth: 1945/10/14 ? ?Transition of Care (TOC) CM/SW Contact:    ?Joseph Art, LCSW ?Phone Number: [phone] ?10/02/2021, 4:02 PM ? ?Clinical Narrative:                 ? ?Patient presents from [facility] due to hip pain from "pop" she felt while being moved for cleaning.  Patient is bed bound and [facility] is her long-term Care facility. Patient's legal guardian is Almeta Monas [phone], [phone].  CSW contacted [name] after contacting [employer] to confirm patient's legal guardian. CSW contact Sharlyne Cai at [facility] to confirm legal guardianship and Ms. Kateri Plummer confirmed it was [name] [name] tate she had not been updated by [facility] or the hospital on patient's current admittance to the hospital.  CSW stated the only contact information on the patient's chart was the main number for [facility] and there was no guardianship paperwork on the chart. [name] expressed surprise but was calm throughout conversation.  CSW updated Ms. Cunningham on patient discharge plan. CSW stated discharge was pending orthopedic surgeon for medical clearance for discharge.  CSW stated I would contact Attending and update her on contact information and request Attending contact Ms. Cunningham.  CSW stated once the patient was medically stable for discharge the plan was for the patient to return to [facility]. Ms Tomasa Rand verbalized understanding.   ? ?CSW updated Attending on request from Ms. Cunningham for update.  Attending updated CSW on Orthopedic surgeon update for discharge.  Plan for discharge depending on labs is for tomorrow 10/03/2021. ? ?CSW contact [facility] Supervisor and updated her. ? ? ?Expected Discharge Plan: [facility]) ?Barriers to Discharge: Continued Medical Work up ? ? ?Patient Goals  and CMS Choice ?  ?  ?  ? ?Expected Discharge Plan and Services ?Expected Discharge Plan: Long Term Nursing Home [facility]) ?In-house Referral: Clinical Social Work ?  ?  ?Living arrangements for the past 2 months: Single Family Home ?                ?  ?  ?  ?  ?  ?  ?  ?  ?  ?  ? ?Prior Living Arrangements/Services ?Living arrangements for the past 2 months: Single Family Home ?Lives with:: Facility Resident ?Patient language and need for interpreter reviewed:: Yes ?Do you feel safe going back to the place where you live?: Yes      ?Need for Family Participation in Patient Care: Yes (Comment) ?Care giver support system in place?: Yes (comment) ?  ?Criminal Activity/Legal Involvement Pertinent to Current Situation/Hospitalization: No - Comment as needed ? ?Activities of Daily Living ?Home Assistive Devices/Equipment: Wheelchair, Nurse, adult ?ADL Screening (condition at time of admission) ?Patient's cognitive ability adequate to safely complete daily activities?: No ?Is the patient deaf or have difficulty hearing?: No ?Does the patient have difficulty seeing, even when wearing glasses/contacts?: No ?Does the patient have difficulty concentrating, remembering, or making decisions?: No ?Patient able to express need for assistance with ADLs?: Yes ?Does the patient have difficulty dressing or bathing?: Yes ?Independently performs ADLs?: No ?Communication: Independent ?Dressing (OT): Needs assistance ?Is this a change from baseline?: Pre-admission baseline ?Grooming: Needs assistance ?Is this a change from baseline?: Pre-admission baseline ?Feeding: Independent ?Bathing: Needs assistance ?Is this a  change from baseline?: Pre-admission baseline ?Toileting: Needs assistance ?Is this a change from baseline?: Pre-admission baseline ?In/Out Bed: Dependent ?Is this a change from baseline?: Pre-admission baseline ?Walks in Home: Dependent ?Is this a change from baseline?: Pre-admission baseline ?Does the patient have  difficulty walking or climbing stairs?: Yes ?Weakness of Legs: Both ?Weakness of Arms/Hands: Both ? ?Permission Sought/Granted ?Permission sought to share information with : Family Supports ?Permission granted to share information with : Yes, Verbal Permission Granted ? Share Information with NAME: Cunningham,Mildred Doctor, hospital Guardian)   [phone] (Home Phone) ?   ?   ?   ? ?Emotional Assessment ?Appearance:: Appears stated age ?Attitude/Demeanor/Rapport: Engaged ?Affect (typically observed): Stable ?Orientation: : Oriented to Self, Oriented to Place, Oriented to  Time, Oriented to Situation ?Alcohol / Substance Use: Not Applicable ?Psych Involvement: No (comment) ? ?Admission diagnosis:  Closed fracture of neck of right femur, initial encounter (HCC) [S72.001A] ?Closed right hip fracture, initial encounter (HCC) [S72.001A] ?Patient Active Problem List  ? Diagnosis Date Noted  ? Closed right hip fracture, initial encounter (HCC) 10/01/2021  ? Bedbound 10/01/2021  ? 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 Benign essential HTN 09/06/2017  ? Multiple sclerosis (HCC) 03/02/2010  ? ?PCP:  Clovis Cao, MD ?Pharmacy:   ?[facility], [place] - [address] ?[address] ?[place] [address] ?Phone: [phone] Fax: [phone] ? ? ? ? ?Social Determinants of Health (SDOH) Interventions ?  ? ?Readmission Risk Interventions ?No flowsheet data found. ? ? ?

## 2021-10-02 NOTE — Op Note (Signed)
OPERATIVE NOTE ? ?DATE OF SURGERY:  10/01/2021 ? ?PATIENT NAME:  Donna Adkins   ?DOB: Nov 16, 1945  ?MRN: [id number] ? ? ?PRE-OPERATIVE DIAGNOSIS:  Right nondisplaced femoral neck fracture ? ?POST-OPERATIVE DIAGNOSIS:  Same ? ?PROCEDURE:  Right hip percutaneous screw fixation ? ?SURGEON:  Nida Boatman, M.D.  ? ?ASSISTANT: None ? ?ANESTHESIA: general ? ?ESTIMATED BLOOD LOSS: 50 mL ? ?FLUIDS REPLACED: See anesthesia record ? ?TOURNIQUET TIME: None  ? ?DRAINS: None ? ?IMPLANTS UTILIZED: 2 x 6.82m cannulated screws, 1 x 7.3 mm cannulated screws.  ? ?INDICATIONS FOR SURGERY: CAahna Rossais a nonambulatory 76y.o. year old female who has been seen for complaints of acute onset, excruciating right hip pain with transfers and hygiene over the last 3-4 days. After discussion of the risks and benefits of surgical intervention, the patient expressed understanding of the risks benefits and agree with plans for Right hip percutaneous screw fixation with no guarantee of pain relief.  ? ?PROCEDURE IN DETAIL: The patient was met in the pre-operative holding area, the consent was verified and surgical site marked with my initials. The patient was brought back to the operating room and placed on a radiolucent flat top table with a hip bump. It was verified the fracture hadn't displaced and appropriate fluoroscopy views were able to be obtained. The operative extremity was prepped and draped in standard fashion. A timeout was performed with all the room in agreement verifying correct patient, procedure, and pre-operative antibiotics.  ? ? ?Using AP and Lateral views, three guidewires were placed in a standard inverted triangle configuration. The bone quality was poor, therefore the lateral cortex was not opened with a drill. The guidewires were measured and appropriate 6.570mscrews were placed over the guidewires with washers. The superior screws had moderate purchase. The inferior screw pulled out of bone when removing the screw  driver. Attempts were made to advance the screw and washer construct intracortically to improve fixation. This resulted in a lateral wall cortical hole with the screw tip breaching the quadralateral plate, and the screw no longer had purchase. The screw was removed through the cortical hole in the lateral wall with a tonsil. There was no significant  bleeding noted. A new starting point, much more anterior was selected. A 7.11m60mcrew with washer was placed and had a moderate bite similar to the superior screws. The guidewires were removed and final fluoroscopy was taken showing adequate hardware placement.  ? ?The wounds were irrigated and closed with 2-0 monocryl and staples. There was no significant bleeding intra operatively. Sterile dressings were applied.  ? ?Aftercare: The patient has no weightbearing/transfer restrictions. The patient will get 24hrs of antibiotics and follow up with Dr. BowHarlow Mares 10-14 days for wound check and staple removal.  ? ?KylNida Boatman.D.   ?

## 2021-10-02 NOTE — Progress Notes (Signed)
Patient blood pressure continues to drop and now her pulse has increased from baseline. Her last vitals were BP 79/48, P 102, T 98.2. MD notified and on call notified. 500 ml Bolus of NS was ordered and dressing was reinforced with ABD pads and ace wrap due to active bleeding. Patient is still alert and stable and able to make needs known.  ?

## 2021-10-02 NOTE — Progress Notes (Signed)
Notified on call that patient diet order still remains NPO also notified on call that patient blood pressure is hypotensive. Patient currently is alert and oriented with no signs or symptoms of distress.  ?

## 2021-10-02 NOTE — Progress Notes (Signed)
Patient continues to be stable. Blood is transfusing with no adverse reactions from patient.  ?

## 2021-10-03 ENCOUNTER — Encounter: Payer: Self-pay | Admitting: Orthopaedic Surgery

## 2021-10-03 DIAGNOSIS — S72001A Fracture of unspecified part of neck of right femur, initial encounter for closed fracture: Secondary | ICD-10-CM | POA: Diagnosis not present

## 2021-10-03 LAB — BPAM RBC
Blood Product Expiration Date: 202303212359
ISSUE DATE / TIME: 202303120443
Unit Type and Rh: 600

## 2021-10-03 LAB — ABO/RH: ABO/RH(D): A NEG

## 2021-10-03 LAB — TYPE AND SCREEN
ABO/RH(D): A NEG
Antibody Screen: NEGATIVE
Unit division: 0

## 2021-10-03 MED ORDER — TRAMADOL HCL 50 MG PO TABS: 50.0000 mg | ORAL_TABLET | Freq: Four times a day (QID) | ORAL | 0 refills | Status: AC | PRN

## 2021-10-03 MED ORDER — APIXABAN 2.5 MG PO TABS
2.5000 mg | ORAL_TABLET | Freq: Two times a day (BID) | ORAL | Status: DC
Start: 2021-10-03 — End: 2021-10-03
  Administered 2021-10-03: 2.5 mg via ORAL
  Filled 2021-10-03: qty 1

## 2021-10-03 MED ORDER — ONDANSETRON HCL 4 MG/2ML IJ SOLN
4.0000 mg | Freq: Four times a day (QID) | INTRAMUSCULAR | Status: DC | PRN
  Administered 2021-10-03: 4 mg via INTRAVENOUS
  Filled 2021-10-03: qty 2

## 2021-10-03 MED ORDER — HYDROCODONE-ACETAMINOPHEN 5-325 MG PO TABS: 1.0000 | ORAL_TABLET | Freq: Four times a day (QID) | ORAL | 0 refills | Status: AC | PRN

## 2021-10-03 NOTE — NC FL2 (Signed)
?[place] MEDICAID FL2 LEVEL OF CARE SCREENING TOOL  ?  ? ?IDENTIFICATION  ?Patient Name: ?Donna Adkins Birthdate: [birth year] Sex: female Admission Date (Current Location): ?09/30/2021  ?[place] Number: ?  ?  Facility and Address:  ?[facility], [address] [place], [place] [address] ?     Provider Number: ?[id number]  ?Attending Physician Name and Address:  ?Enedina Finner, MD ? Relative Name and Phone Number:  ?Rhunette Croft Legal Guardian [phone] ?   ?Current Level of Care: ?Hospital Recommended Level of Care: ?Nursing Facility Prior Approval Number: ?  ? ?Date Approved/Denied: ?  PASRR Number: ?  ? ?Discharge Plan: ?Other (Comment) (Long Term Care) ?  ? ?Current Diagnoses: ?Patient Active Problem List  ? Diagnosis Date Noted  ? Closed right hip fracture, initial encounter (HCC) 10/01/2021  ? Bedbound 10/01/2021  ? 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 Benign essential HTN 09/06/2017  ? Multiple sclerosis (HCC) 03/02/2010  ? ? ?Orientation RESPIRATION BLADDER Height & Weight   ?  ?  ? Normal Incontinent Weight: 73.9 kg ?Height:  5\' 6"  (167.6 cm)  ?BEHAVIORAL SYMPTOMS/MOOD NEUROLOGICAL BOWEL NUTRITION STATUS  ?    Incontinent Diet (see DC summary)  ?AMBULATORY STATUS COMMUNICATION OF NEEDS Skin   ?Total Care Verbally Normal, Surgical wounds ?  ?  ?  ?    ?     ?     ? ? ?Personal Care Assistance Level of Assistance  ?Total care   ?  ?  ?Total Care Assistance: Maximum assistance  ? ?Functional Limitations Info  ?    ?  ?   ? ? ?SPECIAL CARE FACTORS FREQUENCY  ?    ?  ?  ?  ?  ?  ?  ?   ? ? ?Contractures Contractures Info: Not present  ? ? ?Additional Factors Info  ?Code Status, Allergies Code Status Info: full code ?Allergies Info: NKDA ?  ?  ?  ?   ? ?Current Medications (10/03/2021):  This is the current hospital active medication list ?Current Facility-Administered Medications  ?Medication Dose Route Frequency Provider Last Rate Last  Admin  ? ceFAZolin (ANCEF) IVPB 1 g/50 mL premix  1 g Intravenous Q8H Nappo, Kyle E, MD 100 mL/hr at 10/03/21 0600 1 g at 10/03/21 0600  ? docusate sodium (COLACE) capsule 100 mg  100 mg Oral Daily 10/05/21, MD   100 mg at 10/02/21 0857  ? feeding supplement (ENSURE ENLIVE / ENSURE PLUS) liquid 237 mL  237 mL Oral BID BM 12/02/21, MD   237 mL at 10/02/21 1253  ? fenofibrate tablet 54 mg  54 mg Oral Daily 12/02/21, MD   54 mg at 10/03/21 0831  ? gabapentin (NEURONTIN) capsule 300 mg  300 mg Oral BID 10/05/21, DO   300 mg at 10/03/21 0831  ? HYDROcodone-acetaminophen (NORCO/VICODIN) 5-325 MG per tablet 1-2 tablet  1-2 tablet Oral Q6H PRN 10/05/21, DO   2 tablet at 10/03/21 0413  ? lactated ringers infusion   Intravenous Continuous 10/05/21, MD 50 mL/hr at 10/03/21 0655 New Bag at 10/03/21 0655  ? morphine (PF) 2 MG/ML injection 1 mg  1 mg Intravenous Q2H PRN 10/05/21, DO   1 mg at 10/01/21 1049  ? multivitamin with minerals tablet 1 tablet  1 tablet Oral Daily 12/01/21, MD   1 tablet at 10/03/21 0831  ? ondansetron (ZOFRAN) injection 4 mg  4 mg  Intravenous Q6H PRN Enedina Finner, MD   4 mg at 10/03/21 8937  ? polyethylene glycol (MIRALAX / GLYCOLAX) packet 17 g  17 g Oral Daily Enedina Finner, MD   17 g at 10/03/21 0831  ? risperiDONE (RISPERDAL) tablet 0.5 mg  0.5 mg Oral BID Enedina Finner, MD   0.5 mg at 10/03/21 0831  ? traMADol (ULTRAM) tablet 50 mg  50 mg Oral Q6H PRN Enedina Finner, MD   50 mg at 10/03/21 0831  ? ? ? ?Discharge Medications: ?Please see discharge summary for a list of discharge medications. ? ?Relevant Imaging Results: ? ?Relevant Lab Results: ? ? ?Additional Information ?SS# [id number] ? ?Marlowe Sax, RN ? ? ? ? ?

## 2021-10-03 NOTE — TOC Progression Note (Signed)
Transition of Care (TOC) - Progression Note  ? ? ?Patient Details  ?Name: Donna Adkins ?MRN: [id number] ?Date of Birth: [birth year] ? ?Transition of Care (TOC) CM/SW Contact  ?Marlowe Sax, RN ?Phone Number: ?10/03/2021, 12:10 PM ? ?Clinical Narrative:   Mickey Farber, legal guardian [phone], let her know that the patient will transport via EMS back to [facility]  ?[facility] and she is agreeable ? ? ? ?Expected Discharge Plan: Long Term Nursing Home [facility]) ?Barriers to Discharge: Continued Medical Work up ? ?Expected Discharge Plan and Services ?Expected Discharge Plan: Long Term Nursing Home [facility]) ?In-house Referral: Clinical Social Work ?  ?  ?Living arrangements for the past 2 months: Single Family Home ?                ?  ?  ?  ?  ?  ?  ?  ?  ?  ?  ? ? ?Social Determinants of Health (SDOH) Interventions ?  ? ?Readmission Risk Interventions ?No flowsheet data found. ? ?

## 2021-10-03 NOTE — Plan of Care (Signed)
Patient sleeping between care. Pain controlled. Old drainage on dressing but no additional interventions needed. Bed alarm on. ? ? ?Problem: Education: ?Goal: Knowledge of General Education information will improve ?Description: Including pain rating scale, medication(s)/side effects and non-pharmacologic comfort measures ?Outcome: Progressing ?  ?Problem: Health Behavior/Discharge Planning: ?Goal: Ability to manage health-related needs will improve ?Outcome: Progressing ?  ?Problem: Clinical Measurements: ?Goal: Ability to maintain clinical measurements within normal limits will improve ?Outcome: Progressing ?Goal: Will remain free from infection ?Outcome: Progressing ?Goal: Diagnostic test results will improve ?Outcome: Progressing ?Goal: Respiratory complications will improve ?Outcome: Progressing ?Goal: Cardiovascular complication will be avoided ?Outcome: Progressing ?  ?Problem: Activity: ?Goal: Risk for activity intolerance will decrease ?Outcome: Progressing ?  ?Problem: Nutrition: ?Goal: Adequate nutrition will be maintained ?Outcome: Progressing ?  ?Problem: Coping: ?Goal: Level of anxiety will decrease ?Outcome: Progressing ?  ?Problem: Elimination: ?Goal: Will not experience complications related to bowel motility ?Outcome: Progressing ?Goal: Will not experience complications related to urinary retention ?Outcome: Progressing ?  ?Problem: Pain Managment: ?Goal: General experience of comfort will improve ?Outcome: Progressing ?  ?Problem: Safety: ?Goal: Ability to remain free from injury will improve ?Outcome: Progressing ?  ?Problem: Skin Integrity: ?Goal: Risk for impaired skin integrity will decrease ?Outcome: Progressing ?  ?

## 2021-10-03 NOTE — Anesthesia Postprocedure Evaluation (Signed)
Anesthesia Post Note ? ?Patient: Donna Adkins ? ?Procedure(s) Performed: CANNULATED HIP PINNING (Right: Hip) ? ?Patient location during evaluation: PACU ?Anesthesia Type: General ?Level of consciousness: awake and alert ?Pain management: pain level controlled ?Vital Signs Assessment: post-procedure vital signs reviewed and stable ?Respiratory status: spontaneous breathing, nonlabored ventilation, respiratory function stable and patient connected to nasal cannula oxygen ?Cardiovascular status: blood pressure returned to baseline and stable ?Postop Assessment: no apparent nausea or vomiting ?Anesthetic complications: no ? ? ?No notable events documented. ? ? ?Last Vitals:  ?Vitals:  ? 10/03/21 0403 10/03/21 0737  ?BP: (!) 106/58 (!) 100/59  ?Pulse: 87 80  ?Resp: 20 18  ?Temp: 37 ?C 37 ?C  ?SpO2: 94% 94%  ?  ?Last Pain:  ?Vitals:  ? 10/03/21 0458  ?TempSrc:   ?PainSc: 6   ? ? ?  ?  ?  ?  ?  ?  ? ?Corinda Gubler ? ? ? ? ?

## 2021-10-03 NOTE — TOC Progression Note (Signed)
Transition of Care (TOC) - Progression Note  ? ? ?Patient Details  ?Name: Donna Adkins ?MRN: [id number] ?Date of Birth: [birth year] ? ?Transition of Care (TOC) CM/SW Contact  ?Marlowe Sax, RN ?Phone Number: ?10/03/2021, 12:46 PM ? ?Clinical Narrative:    ?Going to room 330 B Spoke with Rhunette Croft the legal guardian and she is agreeabkle to the patient returning to [facility] today ? ? ?Expected Discharge Plan: Long Term Nursing Home [facility]) ?Barriers to Discharge: Continued Medical Work up ? ?Expected Discharge Plan and Services ?Expected Discharge Plan: Long Term Nursing Home [facility]) ?In-house Referral: Clinical Social Work ?  ?  ?Living arrangements for the past 2 months: Single Family Home ?Expected Discharge Date: 10/03/21               ?  ?  ?  ?  ?  ?  ?  ?  ?  ?  ? ? ?Social Determinants of Health (SDOH) Interventions ?  ? ?Readmission Risk Interventions ?No flowsheet data found. ? ?

## 2021-10-03 NOTE — Progress Notes (Signed)
Report given to [place] at [facility]. IV x2 removed, purewick to be removed upon EMS arrival. Patient aware of transport. ?

## 2021-10-03 NOTE — Progress Notes (Signed)
Surgical dressing to right hip clean, dry, and intact. No signs of bleeding. Ace bandage around leg. Dr. Allena Katz okay with patient being discharged without being changed, to notify [facility] to reinforce dressing.  ?

## 2021-10-03 NOTE — Discharge Summary (Signed)
Physician Discharge Summary   Patient: Donna Adkins MRN: [id number] DOB: 1946-01-19  Admit date:     09/30/2021  Discharge date: 10/03/21  Discharge Physician: Enedina Finner   PCP: Clovis Cao, MD   Recommendations at discharge:   follow-up Dr. 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  Patient states that she felt a pop after she was being turned in bed   X-rays showed a right femoral neck fracture.   Close right hip fracture, no fall -- orthopedic surgery consultation with Dr.Nappo/bowers -- IV fluids -- PRN pain meds -- patient is bedbound status --POD# 2 received 1 unit BT in the setting of post op hypotension. Hgb 10.2 -- okay from Dr. Odis Luster standpoint for discharge. Patient had dressing change yesterday. Reinforce dressing per Dr. Odis Luster recommendation. Follow-up in 1 to 2 weeks.   chronic bedbound -- from [facility]   history of multiple sclerosis   chronic DVT of popliteal vein -- on eliquis-- will resume from today. Okay to resume from surgery standpoint.  Overall appears at baseline. Patient will discharge to [facility] today         Procedures:Right hip percutaneous screw fixation Family communication I had discussed with patient's sister Tomasa Rand who is her legal guardian over the phone . consults :Orthopedic CODE STATUS: Full DVT Prophylaxis :SCD Level of care: Med-Surg Status is: Inpatient       Consultants: Orthopedic Drs Nappo/Bowers Procedures performed: right hip percutaneous pinning  Disposition: Long term care facility Diet recommendation:  Discharge  Diet Orders (From admission, onward)     Start     Ordered   10/03/21 0000  Diet - low sodium heart healthy        10/03/21 1220           Regular diet DISCHARGE MEDICATION: Allergies as of 10/03/2021   No Known Allergies      Medication List     STOP taking these medications    oxyCODONE 5 MG immediate release tablet Commonly known as: Oxy IR/ROXICODONE       TAKE these medications    acetaminophen 500 MG tablet Commonly known as: TYLENOL Take 1,000 mg by mouth every 6 (six) hours as needed.   apixaban 2.5 MG Tabs tablet Commonly known as: ELIQUIS Take by mouth 2 (two) times daily.   ascorbic acid 500 MG tablet Commonly known as: VITAMIN C Take 500 mg by mouth daily.   Biotin 10 MG Caps Take by mouth.   bisacodyl 10 MG suppository Commonly known as: DULCOLAX Place 10 mg rectally.   CALTRATE 600 PLUS-VIT D PO Take by mouth.   CO-Q 10 Omega-3 Fish Oil Caps Take by mouth.   DSS 100 MG Caps Take 100 mg by mouth daily.   fenofibrate 48 MG tablet Commonly known as: TRICOR Take 48 mg by mouth daily.   gabapentin 300 MG capsule Commonly known as: NEURONTIN Take 300 mg by mouth 2 (two) times daily.   GARLIC PO Take by mouth.   HYDROcodone-acetaminophen 5-325 MG tablet Commonly known as: NORCO/VICODIN Take 1-2 tablets by mouth every 6 (six) hours as needed for moderate  pain.   multivitamin tablet Take 1 tablet by mouth daily.   polyethylene glycol 17 g packet Commonly known as: MIRALAX / GLYCOLAX Take 17 g by mouth daily.   risperiDONE 1 MG/ML oral solution Commonly known as: RISPERDAL Take 0.5 mg by mouth 2 (two) times daily.   traMADol 50 MG tablet Commonly known as: ULTRAM Take 1 tablet (50 mg total) by mouth every 6 (six) hours as needed for moderate pain or severe pain. What changed:  how much to take when to take this reasons to take this               Discharge Care Instructions  (From admission, onward)            Start     Ordered   10/03/21 0000  Discharge wound care:       Comments: Per Dr Odis Luster   10/03/21 1220            Follow-up Information     Clovis Cao, MD Follow up.   Specialty: General Practice Contact information: [address] [place] [address] [phone]         Lyndle Herrlich, MD. Schedule an appointment as soon as possible for a visit.   Specialty: Orthopedic Surgery Why: 1-2 weeks post hip surgery Contact information: [address] [place] [address] [phone]                Discharge Exam: Ceasar Mons Weights   09/30/21 2318 10/01/21 0359  Weight: 77.1 kg 73.9 kg     Condition at discharge: fair  The results of significant diagnostics from this hospitalization (including imaging, microbiology, ancillary and laboratory) are listed below for reference.   Imaging Studies: DG Chest Portable 1 View  Result Date: 10/01/2021 CLINICAL DATA:  Femoral neck fracture. EXAM: PORTABLE CHEST 1 VIEW COMPARISON:  None. FINDINGS: The patient is obliquely rotated to the left. The cardiac size is normal. There is no pneumothorax. There is calcification of the transverse aorta, unremarkable mediastinal configuration apart from slight aortic tortuosity. The lungs are slightly emphysematous but clear. No pleural effusion is evident. Osteopenia and mild thoracic dextroscoliosis. No displaced rib fracture is visible. There are bilateral rim calcified breast implants. IMPRESSION: 1. No evidence of acute chest disease.  COPD. 2. Aortic atherosclerosis. 3. Osteopenia. Electronically Signed   By: Almira Bar M.D.   On: 10/01/2021 00:12   DG C-Arm 1-60 Min-No Report  Result Date: 10/01/2021 Fluoroscopy was utilized by the requesting physician.  No radiographic interpretation.   DG C-Arm 1-60 Min-No Report  Result Date: 10/01/2021 Fluoroscopy was utilized by the requesting physician.  No radiographic interpretation.   DG HIP UNILAT WITH PELVIS 2-3  VIEWS RIGHT  Result Date: 10/01/2021 CLINICAL DATA:  Fracture, internal fixation EXAM: DG HIP (WITH OR WITHOUT PELVIS) 2-3V RIGHT COMPARISON:  09/30/2021 FINDINGS: Multiple intraoperative spot images demonstrate internal fixation across the right femoral neck fracture. No hardware complicating feature. Anatomic alignment. IMPRESSION: Internal fixation.  No complicating feature. Electronically Signed   By: Charlett Nose M.D.   On: 10/01/2021 20:48   DG Hip Unilat W or Wo Pelvis 2-3 Views Right  Result Date: 10/01/2021 CLINICAL DATA:  Initial evaluation for acute right leg pain. EXAM: DG HIP (WITH OR WITHOUT PELVIS) 2-3V RIGHT COMPARISON:  None available. FINDINGS: Severe osteopenia, limiting assessment for possible subtle acute nondisplaced fractures. There is a subacute to chronic fracture extending through the intertrochanteric left femoral neck with slight superior subluxation. Femoral  head remains in normal alignment within the acetabulum. Subtle cortical step-off at the iliopectineal line suspicious for a subtle nondisplaced fracture, age indeterminate. Possible additional age indeterminate nondisplaced fracture through the superior left pubic ramus. Remainder of the bony pelvis grossly intact. Cortical step-off at the contralateral right femoral neck suspicious for an acute nondisplaced subcapital femoral neck fracture. Right femoral head remains in normal alignment within the acetabulum. Visualized soft tissues demonstrate no acute finding. Multiple scattered mildly prominent gas-filled loops of bowel noted overlying the mid and lower abdomen. IMPRESSION: 1. Cortical step-off at the right femoral neck, suspicious for an acute nondisplaced subcapital femoral neck fracture. Correlation with physical exam recommended. 2. Subacute to chronic fracture extending through the intertrochanteric left femoral neck. 3. Question additional subtle nondisplaced fractures involving the left iliopectineal line and left  superior ramus, age indeterminate. 4. Severe osteopenia. Electronically Signed   By: [name] M.D.   On: 10/01/2021 00:17    Microbiology: Results for orders placed or performed during the hospital encounter of 09/30/21  Resp Panel by RT-PCR (Flu A&B, Covid) Nasopharyngeal Swab     Status: None   Collection Time: 09/30/21 11:28 PM   Specimen: Nasopharyngeal Swab; Nasopharyngeal(NP) swabs in vial transport medium  Result Value Ref Range Status   SARS Coronavirus 2 by RT PCR NEGATIVE NEGATIVE Final    Comment: (NOTE) SARS-CoV-2 target nucleic acids are NOT DETECTED.  The SARS-CoV-2 RNA is generally detectable in upper respiratory specimens during the acute phase of infection. The lowest concentration of SARS-CoV-2 viral copies this assay can detect is 138 copies/mL. A negative result does not preclude SARS-Cov-2 infection and should not be used as the sole basis for treatment or other patient management decisions. A negative result may occur with  improper specimen collection/handling, submission of specimen other than nasopharyngeal swab, presence of viral mutation(s) within the areas targeted by this assay, and inadequate number of viral copies(<138 copies/mL). A negative result must be combined with clinical observations, patient history, and epidemiological information. The expected result is Negative.  Fact Sheet for Patients:  BloggerCourse.com  Fact Sheet for Healthcare Providers:  SeriousBroker.it  This test is no t yet approved or cleared by the [place] FDA and  has been authorized for detection and/or diagnosis of SARS-CoV-2 by FDA under an Emergency Use Authorization (EUA). This EUA will remain  in effect (meaning this test can be used) for the duration of the COVID-19 declaration under Section 564(b)(1) of the Act, 21 U.S.C.section 360bbb-3(b)(1), unless the authorization is terminated  or revoked  sooner.       Influenza A by PCR NEGATIVE NEGATIVE Final   Influenza B by PCR NEGATIVE NEGATIVE Final    Comment: (NOTE) The Xpert Xpress SARS-CoV-2/FLU/RSV plus assay is intended as an aid in the diagnosis of influenza from Nasopharyngeal swab specimens and should not be used as a sole basis for treatment. Nasal washings and aspirates are unacceptable for Xpert Xpress SARS-CoV-2/FLU/RSV testing.  Fact Sheet for Patients: BloggerCourse.com  Fact Sheet for Healthcare Providers: SeriousBroker.it  This test is not yet approved or cleared by the [place] FDA and has been authorized for detection and/or diagnosis of SARS-CoV-2 by FDA under an Emergency Use Authorization (EUA). This EUA will remain in effect (meaning this test can be used) for the duration of the COVID-19 declaration under Section 564(b)(1) of the Act, 21 U.S.C. section 360bbb-3(b)(1), unless the authorization is terminated or revoked.  Performed at [facility], [address], [place], [place] [address]  Labs: CBC: Recent Labs  Lab 09/30/21 2320 10/02/21 0329 10/02/21 1034  WBC 6.8 13.6*  --   NEUTROABS 4.8 12.3*  --   HGB 10.8* 8.7* 10.2*  HCT 33.2* 26.4* 30.4*  MCV 93.8 94.6  --   PLT 191 187  --    Basic Metabolic Panel: Recent Labs  Lab 09/30/21 2320 10/02/21 0329  NA 135 135  K 4.0 4.4  CL 104 104  CO2 26 19*  GLUCOSE 144* 160*  BUN 12 13  CREATININE 0.59 0.60  CALCIUM 9.4 8.8*  MG  --  1.6*   Liver Function Tests: Recent Labs  Lab 10/02/21 0329  AST 23  ALT 12  ALKPHOS 43  BILITOT 0.9  PROT 5.3*  ALBUMIN 2.1*   CBG: Recent Labs  Lab 10/02/21 0312  GLUCAP 160*    Discharge time spent: greater than 30 minutes.  Signed: Enedina Finner, MD [facility] 10/03/2021

## 2021-10-03 NOTE — Discharge Instructions (Signed)
Reinforce dressing changes as per Dr Odis Luster ?

## 2021-10-03 NOTE — TOC Progression Note (Signed)
Transition of Care (TOC) - Progression Note  ? ? ?Patient Details  ?Name: Donna Adkins ?MRN: [id number] ?Date of Birth: 1946-01-24 ? ?Transition of Care (TOC) CM/SW Contact  ?Marlowe Sax, RN ?Phone Number: ?10/03/2021, 2:18 PM ? ?Clinical Narrative:   Called ems to transport, Her Legal guardian mildred is aware ?She is 2nd on list ? ? ? ?Expected Discharge Plan: Long Term Nursing Home [facility]) ?Barriers to Discharge: Continued Medical Work up ? ?Expected Discharge Plan and Services ?Expected Discharge Plan: Long Term Nursing Home [facility]) ?In-house Referral: Clinical Social Work ?  ?  ?Living arrangements for the past 2 months: Single Family Home ?Expected Discharge Date: 10/03/21               ?  ?  ?  ?  ?  ?  ?  ?  ?  ?  ? ? ?Social Determinants of Health (SDOH) Interventions ?  ? ?Readmission Risk Interventions ?No flowsheet data found. ? ?

## 2022-02-02 ENCOUNTER — Emergency Department: Payer: Medicare Other

## 2022-02-02 ENCOUNTER — Emergency Department
Admission: EM | Admit: 2022-02-02 | Discharge: 2022-02-02 | Disposition: A | Discharge status: Home / Self Care | Payer: Medicare Other | Attending: Emergency Medicine | Admitting: Emergency Medicine

## 2022-02-02 ENCOUNTER — Encounter: Payer: Self-pay | Admitting: Emergency Medicine

## 2022-02-02 ENCOUNTER — Other Ambulatory Visit: Payer: Self-pay

## 2022-02-02 DIAGNOSIS — I10 Essential (primary) hypertension: Secondary | ICD-10-CM | POA: Diagnosis not present

## 2022-02-02 DIAGNOSIS — M818 Other osteoporosis without current pathological fracture: Secondary | ICD-10-CM | POA: Diagnosis not present

## 2022-02-02 DIAGNOSIS — D649 Anemia, unspecified: Secondary | ICD-10-CM | POA: Insufficient documentation

## 2022-02-02 DIAGNOSIS — M810 Age-related osteoporosis without current pathological fracture: Secondary | ICD-10-CM

## 2022-02-02 DIAGNOSIS — Z7901 Long term (current) use of anticoagulants: Secondary | ICD-10-CM | POA: Insufficient documentation

## 2022-02-02 DIAGNOSIS — G8929 Other chronic pain: Secondary | ICD-10-CM

## 2022-02-02 DIAGNOSIS — M25561 Pain in right knee: Secondary | ICD-10-CM | POA: Diagnosis present

## 2022-02-02 LAB — BASIC METABOLIC PANEL
Anion gap: 7 (ref 5–15)
BUN: 16 mg/dL (ref 8–23)
CO2: 27 mmol/L (ref 22–32)
Calcium: 9.6 mg/dL (ref 8.9–10.3)
Chloride: 107 mmol/L (ref 98–111)
Creatinine, Ser: 0.5 mg/dL (ref 0.44–1.00)
GFR, Estimated: >60 mL/min (ref >60)
Glucose, Bld: 113 mg/dL — ABNORMAL HIGH (ref 70–99)
Potassium: 5 mmol/L (ref 3.5–5.1)
Sodium: 141 mmol/L (ref 135–145)

## 2022-02-02 LAB — CBC WITH DIFFERENTIAL/PLATELET
Abs Immature Granulocytes: 0.01 10*3/uL (ref 0.00–0.07)
Basophils Absolute: 0 10*3/uL (ref 0.0–0.1)
Basophils Relative: 1 %
Eosinophils Absolute: 0.4 10*3/uL (ref 0.0–0.5)
Eosinophils Relative: 8 %
HCT: 36.6 % (ref 36.0–46.0)
Hemoglobin: 11.9 g/dL — ABNORMAL LOW (ref 12.0–15.0)
Immature Granulocytes: 0 %
Lymphocytes Relative: 20 %
Lymphs Abs: 1 10*3/uL (ref 0.7–4.0)
MCH: 31 pg (ref 26.0–34.0)
MCHC: 32.5 g/dL (ref 30.0–36.0)
MCV: 95.3 fL (ref 80.0–100.0)
Monocytes Absolute: 0.7 10*3/uL (ref 0.1–1.0)
Monocytes Relative: 14 %
Neutro Abs: 2.8 10*3/uL (ref 1.7–7.7)
Neutrophils Relative %: 57 %
Platelets: 236 10*3/uL (ref 150–400)
RBC: 3.84 MIL/uL — ABNORMAL LOW (ref 3.87–5.11)
RDW: 15.3 % (ref 11.5–15.5)
WBC: 4.9 10*3/uL (ref 4.0–10.5)
nRBC: 0 % (ref 0.0–0.2)

## 2022-02-02 MED ORDER — DICLOFENAC SODIUM 1 % EX GEL
2.0000 g | CUTANEOUS | Status: AC
  Administered 2022-02-02: 2 g via TOPICAL
  Filled 2022-02-02: qty 100

## 2022-02-02 NOTE — Discharge Instructions (Addendum)
Your CT today or your R hip and R knee today showed: 1. No definite acute fracture or dislocation. Severe osteoporosis. 2. Fracture of the right femoral neck status post prior ORIF. 3. Old femoral fractures.

## 2022-02-02 NOTE — ED Triage Notes (Signed)
Pt to ED via [facility] from [facility], for hip fx and femur fx. Pt was seen today by facility NP for chronic knee pain, Facility provider order XRAY which showed muliple fractures present to R femur and Hip. Pt denies recent falls due to her being bed bound.  Pt states her knee pain has gotten worse over past couple days, denies any new pain.  Pt takes eliquis.   Pt has hx of R hip fx in march 2023  Pt is A&Ox4

## 2022-02-02 NOTE — ED Provider Notes (Addendum)
[facility] Provider Note    Event Date/Time   First MD Initiated Contact with Patient 02/02/22 1627     (approximate)   History   Hip Injury   HPI  Donna Adkins is a 76 y.o. female   with a history of multiple sclerosis, hypertension, history of DVT on Eliquis, hx of left hip fracture managed nonoperatively and nontraumatic closed right hip fracture managed surgically with screw fixation discharged on 3/13 who presents from nursing facility after x-rays were obtained that showed concern for possible femur fracture.  Patient reports that she is bedbound and has not had any trauma injuries or falls and that she has been in pain since she was discharged without any significant new hip pain.  She states that over the last several weeks she has had more pain in her right knee.  She denies any pain in her left lower extremity or any other acute back pain, Donnell pain, chest pain, cough, shortness of breath, nausea, vomiting or diarrhea or rash or any other sick symptoms.  Paperwork accompanying patient shows right pelvis x-ray obtained on 7/12 is read as "severe osteopenia.  Chronic distal right femur fracture.  Moderate anterior knee soft tissue swelling.  Cannot exclude an occult fracture."      Physical Exam  Triage Vital Signs: ED Triage Vitals  Enc Vitals Group     BP      Pulse      Resp      Temp      Temp src      SpO2      Weight      Height      Head Circumference      Peak Flow      Pain Score      Pain Loc      Pain Edu?      Excl. in GC?     Most recent vital signs: Vitals:   02/02/22 1641 02/02/22 1937  BP: 117/69 112/64  Pulse: 64 65  Resp: 18 20  Temp: 98.1 F (36.7 C)   SpO2: 97% 97%    General: Awake, no distress.  CV:  Good peripheral perfusion.  2+ radial pulses Resp:  Normal effort.  Clear bilaterally Abd:  No distention.  Soft. Other:  Patient's legs are extremely edematous.  With some tenderness circumferentially  about the hip and right knee.  Sensation is intact to light touch throughout.  She has stage I pressure wound on the underside of her right leg.  Stage II pressure wounds in the left posterior leg without evidence of infection   ED Results / Procedures / Treatments  Labs (all labs ordered are listed, but only abnormal results are displayed) Labs Reviewed  CBC WITH DIFFERENTIAL/PLATELET - Abnormal; Notable for the following components:      Result Value   RBC 3.84 (*)    Hemoglobin 11.9 (*)    All other components within normal limits  BASIC METABOLIC PANEL - Abnormal; Notable for the following components:   Glucose, Bld 113 (*)    All other components within normal limits     EKG    RADIOLOGY  X-ray of the right femur, right knee and right hip on my interpretation show previous ORIF what appears to be healing fractures of the right hip without clear evidence of acute fracture in the hip or knee.  CT of the right hip and knee interpretation without clear acute fracture.  Reviewed radiology interpretation  and agree with the findings of old femoral fracture, previous right femoral neck fracture and osteoporosis without clear acute fracture or dislocation.   PROCEDURES:  Critical Care performed: No  Procedures    MEDICATIONS ORDERED IN ED: Medications  diclofenac Sodium (VOLTAREN) 1 % topical gel 2 g (has no administration in time range)     IMPRESSION / MDM / ASSESSMENT AND PLAN / ED COURSE  I reviewed the triage vital signs and the nursing notes. Patient's presentation is most consistent with acute presentation with potential threat to life or bodily function.                               Differential diagnosis includes, but is not limited to acute pathologic fracture, chronic nonhealing fractures with overall lower suspicion for DVT given she reports compliance with Eliquis and her history exam at this time is not suggestive of acute infectious process.  BMP  without any significant electrolyte or metabolic derangements.  CBC without leukocytosis and stable anemia with hemoglobin 11.9 compared to 10.24 months ago  X-ray of the right femur, right knee and right hip on my interpretation show previous ORIF what appears to be healing fractures of the right hip without clear evidence of acute fracture in the hip or knee.  CT of the right hip and knee interpretation without clear acute fracture.  Reviewed radiology interpretation and agree with the findings of old femoral fracture, previous right femoral neck fracture and osteoporosis without clear acute fracture or dislocation.  Gust patient's presentation and work-up with on-call orthopedist Dr. Rosita Kea reviewed patient's imaging and felt that there is no indication for emergent MRI or surgical intervention at this time and likely management this point would be pain management but that she could follow-up with him in clinic.  We will trial some topical Voltaren gel over the knee as that seems to be where she is having the most.  She is already on oxycodone.  Discussed that she can follow-up with her PCP for additional pain management options.  Discharged in stable condition.  Strict and precautions advised and discussed.     FINAL CLINICAL IMPRESSION(S) / ED DIAGNOSES   Final diagnoses:  Chronic pain of right lower extremity  Osteoporosis, unspecified osteoporosis type, unspecified pathological fracture presence     Rx / DC Orders   ED Discharge Orders     None        Note:  This document was prepared using Dragon voice recognition software and may include unintentional dictation errors.   Gilles Chiquito, MD 02/02/22 2019    Gilles Chiquito, MD 02/02/22 2036

## 2022-02-02 NOTE — ED Notes (Signed)
This Clinical research associate spoke with Pt legal guardian Donna Adkins in regards to pt disposition. [name] understood and expressed no further questions.

## 2022-02-02 NOTE — ED Notes (Signed)
This RN gave report to Tresa Endo, Nurse at [facility] in regards to pt disposition.

## 2022-10-16 IMAGING — XA DG HIP (WITH OR WITHOUT PELVIS) 2-3V*R*
1 series · 7 of 7 positions shown · non-contrast
Comparison: 09/30/2021

CLINICAL DATA: Fracture, internal fixation

EXAM:
DG HIP (WITH OR WITHOUT PELVIS) 2-3V RIGHT

[Series 1: unknown protocol · 0.20mm/px · 7 of 7 slices shown]
[im 1/7]
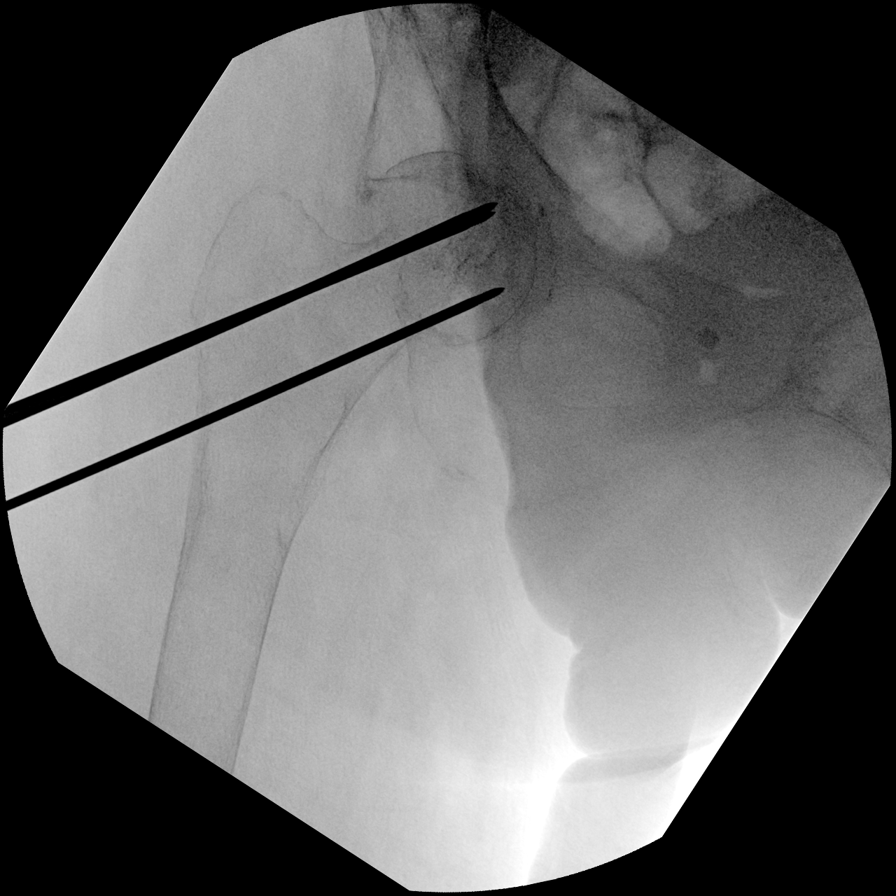
[im 2/7]
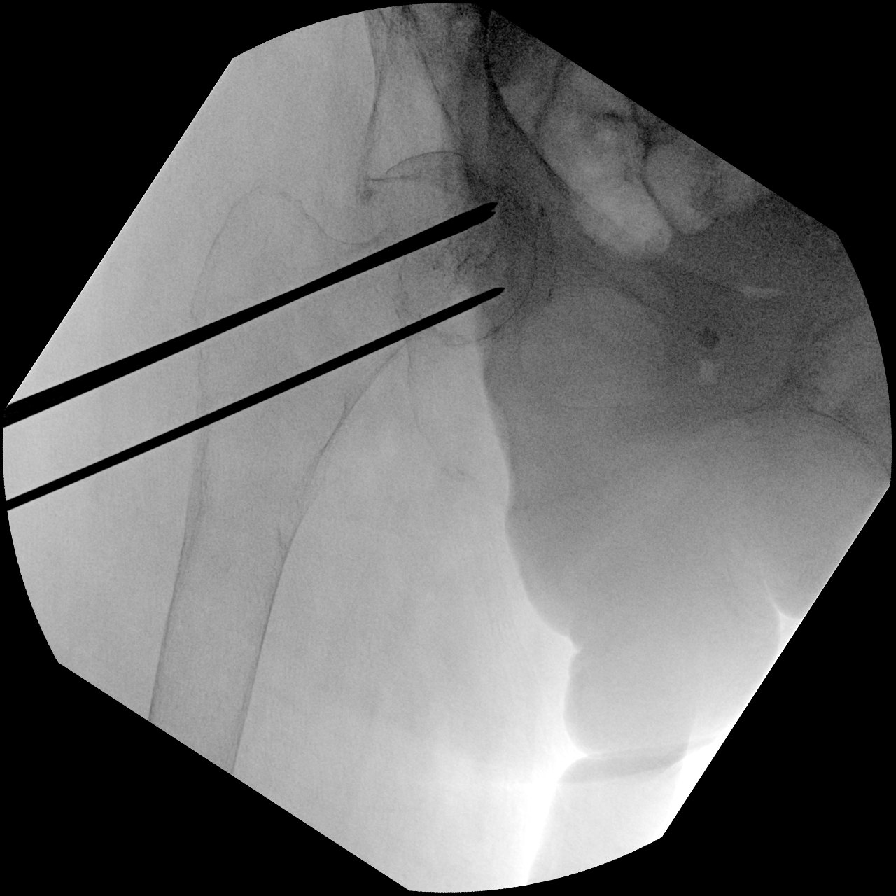
[im 3/7]
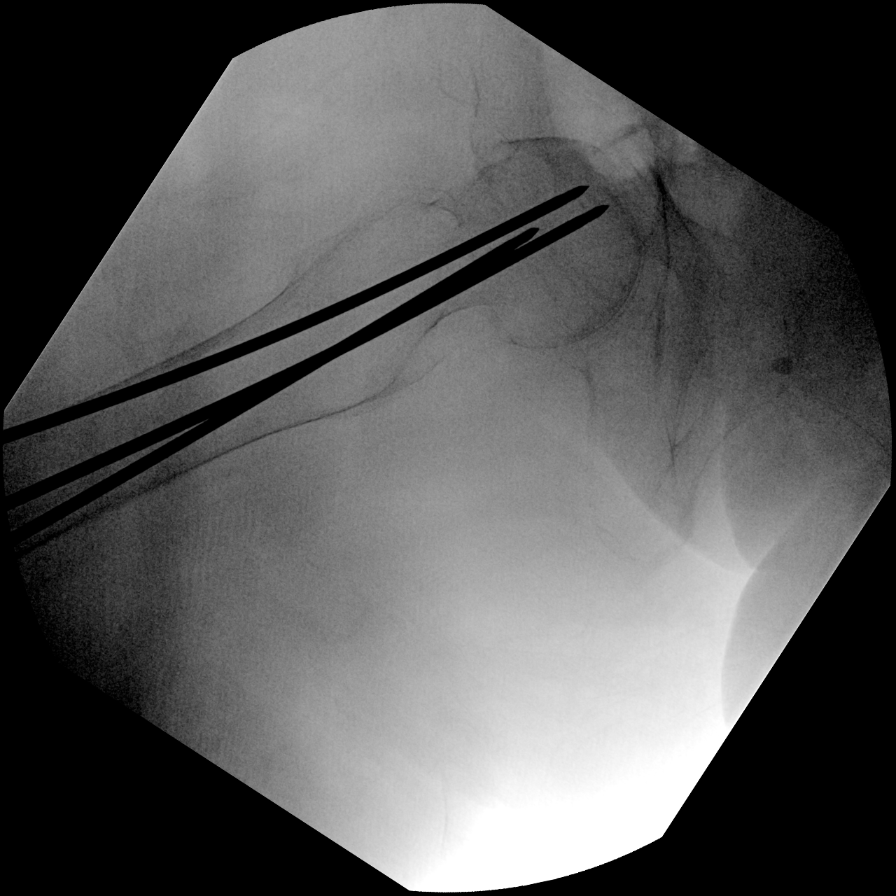
[im 4/7]
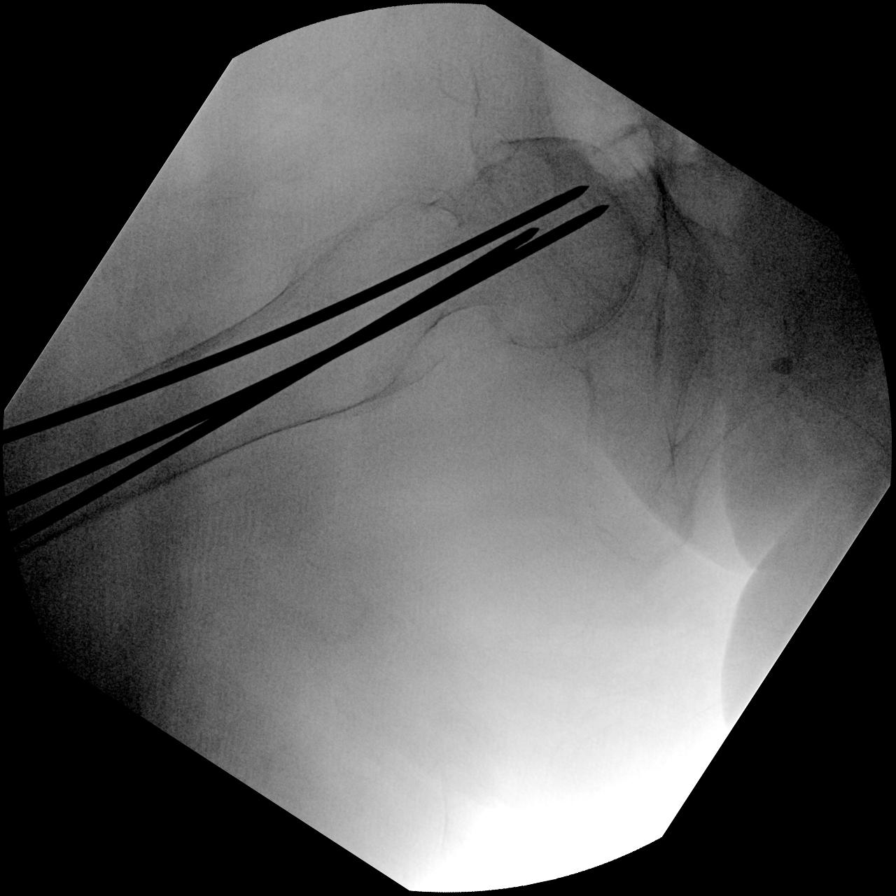
[im 5/7]
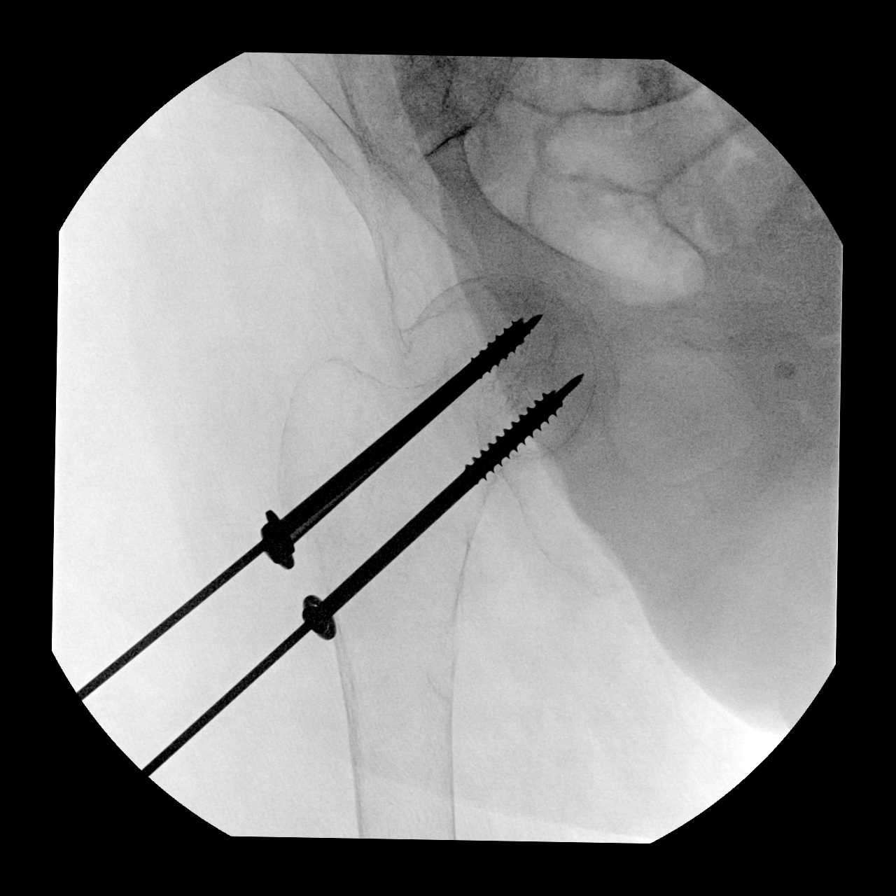
[im 6/7]
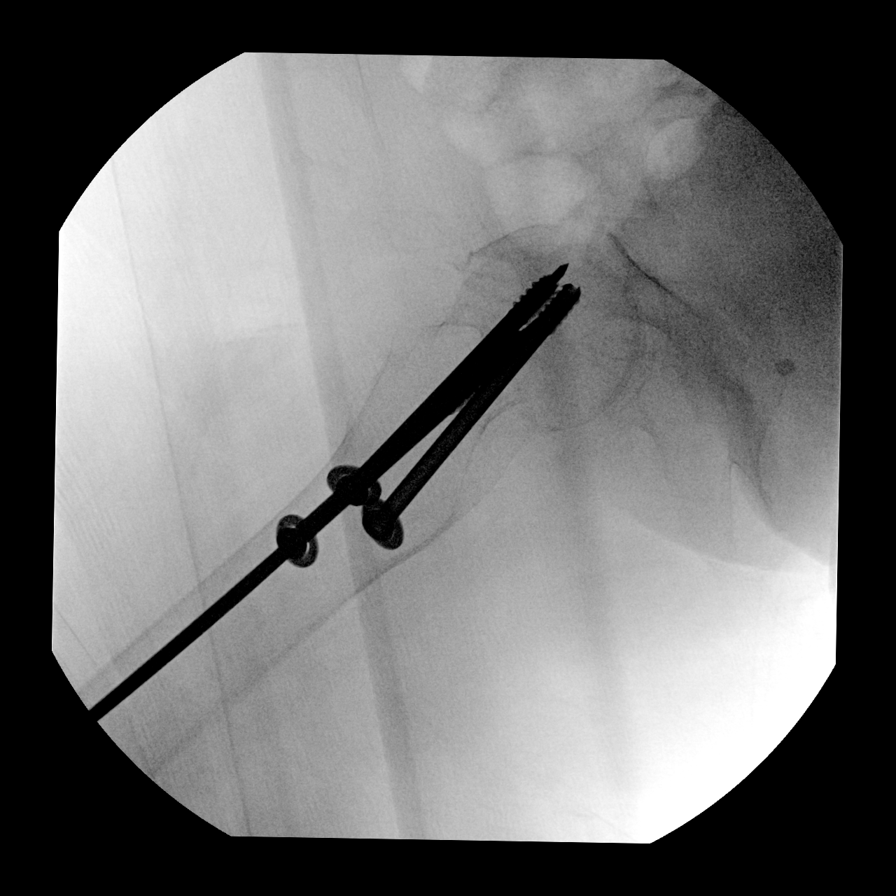
[im 7/7]
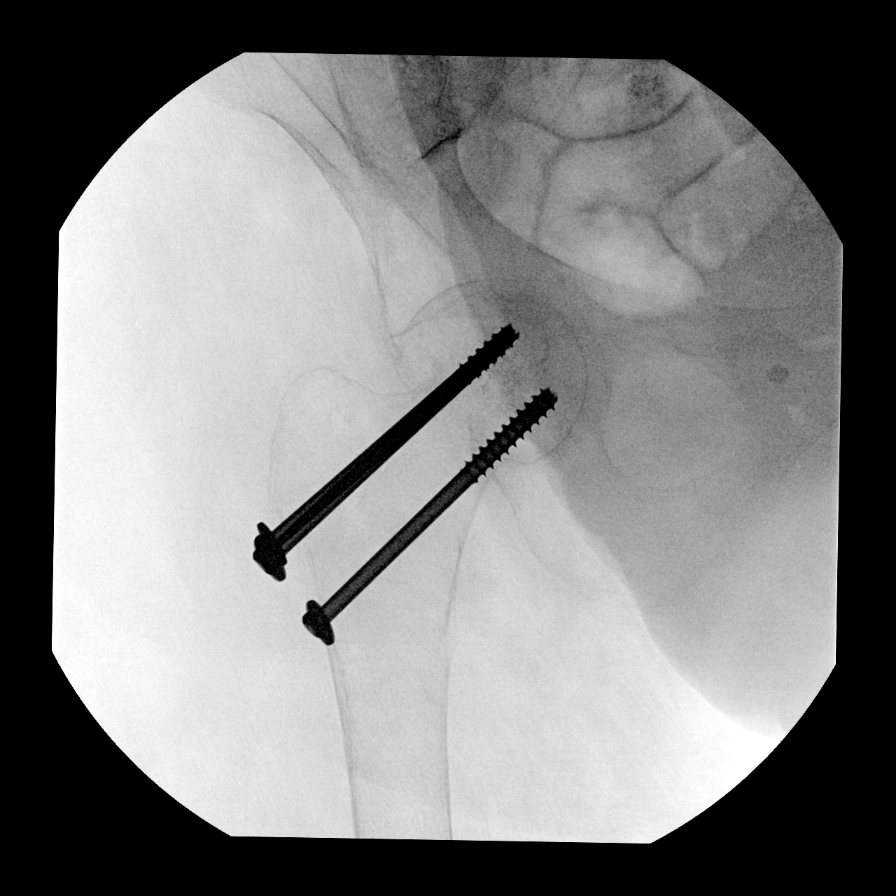

[7 of 7 positions shown; findings below may reference images not displayed]

FINDINGS: Multiple intraoperative spot images demonstrate internal fixation
across the right femoral neck fracture. No hardware complicating
feature. Anatomic alignment.
IMPRESSION: Internal fixation.  No complicating feature.

## 2023-08-26 ENCOUNTER — Other Ambulatory Visit: Payer: Self-pay

## 2023-08-26 ENCOUNTER — Emergency Department
Admission: EM | Admit: 2023-08-26 | Discharge: 2023-08-26 | Disposition: A | Discharge status: Skilled Nursing Facility | Payer: Medicare Other | Attending: Emergency Medicine | Admitting: Emergency Medicine

## 2023-08-26 DIAGNOSIS — R112 Nausea with vomiting, unspecified: Secondary | ICD-10-CM | POA: Diagnosis present

## 2023-08-26 DIAGNOSIS — E785 Hyperlipidemia, unspecified: Secondary | ICD-10-CM | POA: Diagnosis not present

## 2023-08-26 DIAGNOSIS — G35 Multiple sclerosis: Secondary | ICD-10-CM | POA: Insufficient documentation

## 2023-08-26 DIAGNOSIS — Z20822 Contact with and (suspected) exposure to covid-19: Secondary | ICD-10-CM | POA: Insufficient documentation

## 2023-08-26 LAB — COMPREHENSIVE METABOLIC PANEL
ALT: 12 U/L (ref 0–44)
AST: 22 U/L (ref 15–41)
Albumin: 3.1 g/dL — ABNORMAL LOW (ref 3.5–5.0)
Alkaline Phosphatase: 52 U/L (ref 38–126)
Anion gap: 9 (ref 5–15)
BUN: 11 mg/dL (ref 8–23)
CO2: 24 mmol/L (ref 22–32)
Calcium: 9.8 mg/dL (ref 8.9–10.3)
Chloride: 101 mmol/L (ref 98–111)
Creatinine, Ser: 0.4 mg/dL — ABNORMAL LOW (ref 0.44–1.00)
GFR, Estimated: >60 mL/min (ref >60)
Glucose, Bld: 97 mg/dL (ref 70–99)
Potassium: 4 mmol/L (ref 3.5–5.1)
Sodium: 134 mmol/L — ABNORMAL LOW (ref 135–145)
Total Bilirubin: 0.4 mg/dL (ref 0.0–1.2)
Total Protein: 7.1 g/dL (ref 6.5–8.1)

## 2023-08-26 LAB — CBC WITH DIFFERENTIAL/PLATELET
Abs Immature Granulocytes: 0.02 10*3/uL (ref 0.00–0.07)
Basophils Absolute: 0 10*3/uL (ref 0.0–0.1)
Basophils Relative: 0 %
Eosinophils Absolute: 0.2 10*3/uL (ref 0.0–0.5)
Eosinophils Relative: 5 %
HCT: 41.8 % (ref 36.0–46.0)
Hemoglobin: 13.4 g/dL (ref 12.0–15.0)
Immature Granulocytes: 0 %
Lymphocytes Relative: 15 %
Lymphs Abs: 0.8 10*3/uL (ref 0.7–4.0)
MCH: 31.7 pg (ref 26.0–34.0)
MCHC: 32.1 g/dL (ref 30.0–36.0)
MCV: 98.8 fL (ref 80.0–100.0)
Monocytes Absolute: 0.6 10*3/uL (ref 0.1–1.0)
Monocytes Relative: 11 %
Neutro Abs: 3.7 10*3/uL (ref 1.7–7.7)
Neutrophils Relative %: 69 %
Platelets: 247 10*3/uL (ref 150–400)
RBC: 4.23 MIL/uL (ref 3.87–5.11)
RDW: 14.6 % (ref 11.5–15.5)
WBC: 5.2 10*3/uL (ref 4.0–10.5)
nRBC: 0 % (ref 0.0–0.2)

## 2023-08-26 LAB — RESP PANEL BY RT-PCR (RSV, FLU A&B, COVID)  RVPGX2
Influenza A by PCR: NEGATIVE
Influenza B by PCR: NEGATIVE
Resp Syncytial Virus by PCR: NEGATIVE
SARS Coronavirus 2 by RT PCR: NEGATIVE

## 2023-08-26 MED ORDER — ONDANSETRON 4 MG PO TBDP
4.0000 mg | ORAL_TABLET | Freq: Once | ORAL | Status: AC
  Administered 2023-08-26: 4 mg via ORAL
  Filled 2023-08-26: qty 1

## 2023-08-26 MED ORDER — OXYCODONE HCL 5 MG PO TABS
5.0000 mg | ORAL_TABLET | Freq: Once | ORAL | Status: AC
  Administered 2023-08-26: 5 mg via ORAL
  Filled 2023-08-26: qty 1

## 2023-08-26 MED ORDER — ONDANSETRON 4 MG PO TBDP: 4.0000 mg | ORAL_TABLET | Freq: Three times a day (TID) | ORAL | 0 refills | Status: AC | PRN

## 2023-08-26 NOTE — ED Provider Notes (Signed)
[facility] Provider Note    Event Date/Time   First MD Initiated Contact with Patient 08/26/23 1246     (approximate)  History   Chief Complaint: Emesis  HPI  Donna Adkins is a 78 y.o. female with a past medical history of hyperlipidemia, multiple sclerosis, presents from nursing facility after an episode of nausea and vomiting this morning.  Cording to the patient the past week or so she has had an upper respiratory infection just finished a course of Zithromax to treat her for possible bronchitis.  Patient states she has been feeling very nauseated throughout the night, this morning she tried to eat a bagel and coffee shortly afterwards she vomited so they sent her to the emergency department for evaluation.  Patient states continued mild nausea denies any diarrhea denies any abdominal pain denies any known fever.  Feels like her upper respiratory symptoms cough are resolving but continues have a slight runny nose.  Physical Exam   Triage Vital Signs: ED Triage Vitals [08/26/23 0953]  Encounter Vitals Group     BP 123/84     Systolic BP Percentile      Diastolic BP Percentile      Pulse Rate 71     Resp 18     Temp 98.3 F (36.8 C)     Temp Source Oral     SpO2 96 %     Weight 169 lb 12.1 oz (77 kg)     Height 5\' 6"  (1.676 m)     Head Circumference      Peak Flow      Pain Score 0     Pain Loc      Pain Education      Exclude from Growth Chart     Most recent vital signs: Vitals:   08/26/23 0953  BP: 123/84  Pulse: 71  Resp: 18  Temp: 98.3 F (36.8 C)  SpO2: 96%    General: Awake, no distress.  CV:  Good peripheral perfusion.  Regular rate and rhythm  Resp:  Normal effort.  Equal breath sounds bilaterally.  Abd:  No distention.  Soft, nontender.  No rebound or guarding.  ED Results / Procedures / Treatments   MEDICATIONS ORDERED IN ED: Medications  oxyCODONE (Oxy IR/ROXICODONE) immediate release tablet 5 mg (has no  administration in time range)  ondansetron (ZOFRAN-ODT) disintegrating tablet 4 mg (has no administration in time range)  ondansetron (ZOFRAN-ODT) disintegrating tablet 4 mg (4 mg Oral Given 08/26/23 1200)     IMPRESSION / MDM / ASSESSMENT AND PLAN / ED COURSE  I reviewed the triage vital signs and the nursing notes.  Patient's presentation is most consistent with acute presentation with potential threat to life or bodily function.  Patient presents to the emergency department for nausea and vomiting this morning.  Overall the patient appears well, reassuring physical exam clear lung sounds benign abdomen.  Patient's lab work shows a reassuring CBC, reassuring chemistry and a negative respiratory panel.  Patient denies any blood within the vomitus, denies any diarrhea.  Patient appears very well suspect the patient could be suffering nausea from her upper respiratory illness, her antibiotic, or possibly from gastroenteritis which has been going around heavily recently.  However given the patient's reassuring physical exam reassuring workup and reassuring vitals I believe the patient is safe for discharge home.  Will discharge with Zofran to be used as needed.  Patient did not take her normal oxycodone this morning we will  dose this in the emergency department for the patient prior to discharge.  Patient agreeable to plan of care.  FINAL CLINICAL IMPRESSION(S) / ED DIAGNOSES   Nausea and vomiting  Note:  This document was prepared using Dragon voice recognition software and may include unintentional dictation errors.   Minna Antis, MD 08/26/23 1304

## 2023-08-26 NOTE — ED Triage Notes (Signed)
First Nurse Note;  Pt via [facility] from [facility]. Pt c/o one episode of hematemesis. Pt does take Eliquis but did not take her dose this morning. Pt has a hx of MS. Per EMS pt is at her baseline. Pt is alert. 117/81 BP  79 BP  98.2 oral  99% on RA 18 RR

## 2023-08-26 NOTE — ED Provider Triage Note (Signed)
Emergency Medicine Provider Triage Evaluation Note  Amarilys Lyles, a 78 y.o. female  was evaluated in triage.  Pt complains of emesis. She presents to the 'ED from her facility after a single episode of nonbloody/nonbilious emesis, during breakfast. She had just drank coffee. She would endorses daily blood thinner use. No melena.  Review of Systems  Positive: emesis Negative: FCS  Physical Exam  BP 123/84 (BP Location: Right Arm)   Pulse 71   Temp 98.3 F (36.8 C) (Oral)   Resp 18   Ht 5\' 6"  (1.676 m)   Wt 77 kg   SpO2 96%   BMI 27.40 kg/m  Gen:   Awake, no distress  NAD Resp:  Normal effort  MSK:   Moves extremities without difficulty  ABD:  Soft, nontender  Medical Decision Making  Medically screening exam initiated at 10:12 AM.  Appropriate orders placed.  Evlyn Kanner was informed that the remainder of the evaluation will be completed by another provider, this initial triage assessment does not replace that evaluation, and the importance of remaining in the ED until their evaluation is complete.  Patient to the ED for evaluation of emesis x 1.    Lissa Hoard, PA-C 08/26/23 1013

## 2023-08-26 NOTE — ED Triage Notes (Signed)
Pt states via [facility] with c/o of emesis this morning. Pt denies blood in emesis to this RN, pt state she drank coffee prior to vomiting and states that why vomit is black, pt does take Eliquis.

## 2024-06-12 ENCOUNTER — Other Ambulatory Visit: Payer: Self-pay

## 2024-06-12 ENCOUNTER — Emergency Department
Admission: EM | Admit: 2024-06-12 | Discharge: 2024-06-13 | Disposition: A | Discharge status: Home / Self Care | Attending: Emergency Medicine | Admitting: Emergency Medicine

## 2024-06-12 DIAGNOSIS — Z7901 Long term (current) use of anticoagulants: Secondary | ICD-10-CM | POA: Insufficient documentation

## 2024-06-12 DIAGNOSIS — S82842A Displaced bimalleolar fracture of left lower leg, initial encounter for closed fracture: Secondary | ICD-10-CM | POA: Diagnosis not present

## 2024-06-12 DIAGNOSIS — M25562 Pain in left knee: Secondary | ICD-10-CM | POA: Insufficient documentation

## 2024-06-12 DIAGNOSIS — S79912A Unspecified injury of left hip, initial encounter: Secondary | ICD-10-CM | POA: Insufficient documentation

## 2024-06-12 DIAGNOSIS — E871 Hypo-osmolality and hyponatremia: Secondary | ICD-10-CM | POA: Diagnosis not present

## 2024-06-12 DIAGNOSIS — W19XXXA Unspecified fall, initial encounter: Secondary | ICD-10-CM

## 2024-06-12 DIAGNOSIS — W19XXXD Unspecified fall, subsequent encounter: Secondary | ICD-10-CM | POA: Diagnosis not present

## 2024-06-12 DIAGNOSIS — W06XXXA Fall from bed, initial encounter: Secondary | ICD-10-CM | POA: Insufficient documentation

## 2024-06-12 DIAGNOSIS — S99912A Unspecified injury of left ankle, initial encounter: Secondary | ICD-10-CM | POA: Diagnosis present

## 2024-06-12 DIAGNOSIS — Z86718 Personal history of other venous thrombosis and embolism: Secondary | ICD-10-CM | POA: Diagnosis not present

## 2024-06-12 DIAGNOSIS — M79604 Pain in right leg: Secondary | ICD-10-CM | POA: Diagnosis present

## 2024-06-12 DIAGNOSIS — S0990XA Unspecified injury of head, initial encounter: Secondary | ICD-10-CM | POA: Insufficient documentation

## 2024-06-12 NOTE — ED Triage Notes (Signed)
 Pt arrives via EMS from [facility] after a fall this am between 8-9 am, per ems staff was changing pt and she rolled out of bed, - loc

## 2024-06-12 NOTE — ED Provider Notes (Addendum)
 [facility] Provider Note    Event Date/Time   First MD Initiated Contact with Patient 06/12/24 2308     (approximate)   History   Fall   HPI  Donna Adkins is a 78 y.o. female   Past medical history of multiple sclerosis, hyperlipidemia, left leg burning chronic ulcers, DVT, on Eliquis , bedbound status, here with a fall out of her bed.  Apparently she is being changed and rotated in bed by her nurse and she fell out onto her left side.  Left hip and knee are hurting her.  She did not strike her head.  She has no other acute medical complaints and on review of system denies any respiratory symptoms, chest pain, abdominal pain, nausea vomiting or diarrhea or urinary complaints.  She notes that she has skin changes to her left hip that are being managed at her facility, this is not new.  It is not worsened.  She denies fever.   External Medical Documents Reviewed: Prior neurology note      Physical Exam   Triage Vital Signs: ED Triage Vitals  Encounter Vitals Group     BP 06/12/24 2312 105/64     Girls Systolic BP Percentile --      Girls Diastolic BP Percentile --      Boys Systolic BP Percentile --      Boys Diastolic BP Percentile --      Pulse Rate 06/12/24 2312 74     Resp 06/12/24 2312 16     Temp 06/12/24 2312 (!) 97.5 F (36.4 C)     Temp Source 06/12/24 2312 Oral     SpO2 06/12/24 2312 100 %     Weight 06/12/24 2314 170 lb (77.1 kg)     Height 06/12/24 2314 5' 6 (1.676 m)     Head Circumference --      Peak Flow --      Pain Score 06/12/24 2313 9     Pain Loc --      Pain Education --      Exclude from Growth Chart --     Most recent vital signs: Vitals:   06/12/24 2312  BP: 105/64  Pulse: 74  Resp: 16  Temp: (!) 97.5 F (36.4 C)  SpO2: 100%    General: Awake, no distress.  CV:  Good peripheral perfusion.  Resp:  Normal effort.  Abd:  No distention.  Other:  Awake alert comfortable pleasant woman in no acute  distress, conversant and appropriate.  Hemodynamics reviewed, unremarkable.  Breathing comfortably on room air with clear lungs.  No obvious signs of head injury.  CT and L-spine without deformity or tenderness.  Soft nontender abdomen.  Chest wall without tenderness to palpation.  Atraumatic upper extremities and right lower extremity though the palpation of the left hip and knee elicit pain.  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  She has skin changes on her left lateral thigh that are not appearing infected, and chronic in appearance and per report by patient.  ED Results / Procedures / Treatments   Labs (all labs ordered are listed, but only abnormal results are displayed) Labs Reviewed  CBC WITH DIFFERENTIAL/PLATELET - Abnormal; Notable for the following components:      Result Value   RBC 3.54 (*)    Hemoglobin 11.1 (*)    HCT 32.7 (*)    All other components within normal limits     I ordered and  reviewed the above labs they are notable for cell counts unremarkable, no leukocytosis.    RADIOLOGY I independently reviewed and interpreted CT head and see no obvious bleeding or midline shift I also reviewed radiologist's formal read.   PROCEDURES:  Critical Care performed: No  Procedures   MEDICATIONS ORDERED IN ED: Medications - No data to display   IMPRESSION / MDM / ASSESSMENT AND PLAN / ED COURSE  I reviewed the triage vital signs and the nursing notes.                                Patient's presentation is most consistent with acute presentation with potential threat to life or bodily function.  Differential diagnosis includes, but is not limited to, mechanical fall leading to left hip or knee injury, ICH or C-spine injury    MDM:    Fall from bed after being rotated by nurse this morning, with pain to the left lower extremity with fortunately normal-appearing plain films and CT of the head and neck given her age, fall, anticoagulated  status, appear normal.  Patient without any other acute medical complaints.  Ankle fracture discussed with podiatry consultant Dr. Malvin who reviewed imaging, recommending splinting and nonweightbearing.  Nonoperative right now.  I considered obtaining further labs including electrolytes and CK but patient has been a very hard stick, hemolyzed blood samples, and upon further questioning she had no prolonged downtime was immediately helped back to her bed by her nurse.  I think electrolyte and CK testing is not absolutely necessary as I have very low clinical suspicion for rhabdomyolysis given no significant downtime.  I considered hospitalization for admission or observation given unremarkable workup I think she can be discharged back to her facility.        FINAL CLINICAL IMPRESSION(S) / ED DIAGNOSES   Final diagnoses:  Fall, initial encounter  Hip injury, left, initial encounter  Ankle fracture, bimalleolar, closed, left, initial encounter     Rx / DC Orders   ED Discharge Orders     None        Note:  This document was prepared using Dragon voice recognition software and may include unintentional dictation errors.    Cyrena Mylar, MD 06/13/24 9857    Cyrena Mylar, MD 06/13/24 9856    Cyrena Mylar, MD 06/13/24 [phone]

## 2024-06-13 ENCOUNTER — Emergency Department

## 2024-06-13 ENCOUNTER — Emergency Department
Admission: EM | Admit: 2024-06-13 | Discharge: 2024-06-14 | Disposition: A | Discharge status: Home / Self Care | Source: Home / Self Care | Attending: Emergency Medicine | Admitting: Emergency Medicine

## 2024-06-13 DIAGNOSIS — E871 Hypo-osmolality and hyponatremia: Secondary | ICD-10-CM | POA: Insufficient documentation

## 2024-06-13 DIAGNOSIS — S82842A Displaced bimalleolar fracture of left lower leg, initial encounter for closed fracture: Secondary | ICD-10-CM | POA: Diagnosis not present

## 2024-06-13 DIAGNOSIS — W19XXXD Unspecified fall, subsequent encounter: Secondary | ICD-10-CM | POA: Insufficient documentation

## 2024-06-13 DIAGNOSIS — M79604 Pain in right leg: Secondary | ICD-10-CM | POA: Insufficient documentation

## 2024-06-13 LAB — BASIC METABOLIC PANEL WITH GFR
Anion gap: 7 (ref 5–15)
BUN: 11 mg/dL (ref 8–23)
CO2: 23 mmol/L (ref 22–32)
Calcium: 9.7 mg/dL (ref 8.9–10.3)
Chloride: 98 mmol/L (ref 98–111)
Creatinine, Ser: 0.67 mg/dL (ref 0.44–1.00)
GFR, Estimated: >60 mL/min (ref >60)
Glucose, Bld: 113 mg/dL — ABNORMAL HIGH (ref 70–99)
Potassium: 4.5 mmol/L (ref 3.5–5.1)
Sodium: 128 mmol/L — ABNORMAL LOW (ref 135–145)

## 2024-06-13 LAB — CBC WITH DIFFERENTIAL/PLATELET
Abs Immature Granulocytes: 0.01 K/uL (ref 0.00–0.07)
Basophils Absolute: 0 K/uL (ref 0.0–0.1)
Basophils Relative: 0 %
Eosinophils Absolute: 0.2 K/uL (ref 0.0–0.5)
Eosinophils Relative: 4 %
HCT: 32.7 % — ABNORMAL LOW (ref 36.0–46.0)
Hemoglobin: 11.1 g/dL — ABNORMAL LOW (ref 12.0–15.0)
Immature Granulocytes: 0 %
Lymphocytes Relative: 18 %
Lymphs Abs: 0.9 K/uL (ref 0.7–4.0)
MCH: 31.4 pg (ref 26.0–34.0)
MCHC: 33.9 g/dL (ref 30.0–36.0)
MCV: 92.4 fL (ref 80.0–100.0)
Monocytes Absolute: 0.5 K/uL (ref 0.1–1.0)
Monocytes Relative: 11 %
Neutro Abs: 3.3 K/uL (ref 1.7–7.7)
Neutrophils Relative %: 67 %
Platelets: 211 K/uL (ref 150–400)
RBC: 3.54 MIL/uL — ABNORMAL LOW (ref 3.87–5.11)
RDW: 14.1 % (ref 11.5–15.5)
WBC: 5 K/uL (ref 4.0–10.5)
nRBC: 0 % (ref 0.0–0.2)

## 2024-06-13 LAB — CBC
HCT: 29.4 % — ABNORMAL LOW (ref 36.0–46.0)
Hemoglobin: 10.2 g/dL — ABNORMAL LOW (ref 12.0–15.0)
MCH: 32 pg (ref 26.0–34.0)
MCHC: 34.7 g/dL (ref 30.0–36.0)
MCV: 92.2 fL (ref 80.0–100.0)
Platelets: 209 K/uL (ref 150–400)
RBC: 3.19 MIL/uL — ABNORMAL LOW (ref 3.87–5.11)
RDW: 14 % (ref 11.5–15.5)
WBC: 5.7 K/uL (ref 4.0–10.5)
nRBC: 0 % (ref 0.0–0.2)

## 2024-06-13 MED ORDER — ACETAMINOPHEN 500 MG PO TABS
1000.0000 mg | ORAL_TABLET | Freq: Once | ORAL | Status: AC
  Administered 2024-06-13: 1000 mg via ORAL
  Filled 2024-06-13: qty 2

## 2024-06-13 MED ORDER — OXYCODONE HCL 5 MG PO TABS
5.0000 mg | ORAL_TABLET | Freq: Once | ORAL | Status: AC
Start: 2024-06-13 — End: 2024-06-13
  Administered 2024-06-13: 5 mg via ORAL
  Filled 2024-06-13: qty 1

## 2024-06-13 NOTE — Discharge Instructions (Signed)
 You were seen in the emergency department with concerns for a right femur fracture.  You had x-rays of your right femur today.  You have old femur fractures that look unchanged from prior x-ray.  Your sodium was low  It is importantly follow-up with your primary care physician to recheck your sodium.  If you have any worsening symptoms or concerns return to the emergency department.

## 2024-06-13 NOTE — ED Triage Notes (Signed)
 Patient seen yesterday s/p fall and treated for left fx, now at facility today complaining of right femur pain, ? Right femur fracture

## 2024-06-13 NOTE — Discharge Instructions (Addendum)
 Fortunately your evaluation in the emergency department did not show any injuries that would require hospitalization or surgeries at this time.    You did have an ankle fracture noted on the left ankle from which you should keep the splint on and call Dr. Malvin who is a foot and ankle specialist for follow-up appointment.   Thank you for choosing us  for your health care today!  Please see your primary doctor this week for a follow up appointment.   If you have any new, worsening, or unexpected symptoms call your doctor right away or come back to the emergency department for reevaluation.  It was my pleasure to care for you today.   Ginnie EDISON Cyrena, MD

## 2024-06-13 NOTE — ED Notes (Signed)
 Notified lab to send phlebotomist for redraw.

## 2024-06-13 NOTE — ED Provider Notes (Signed)
 [facility] Provider Note    Event Date/Time   First MD Initiated Contact with Patient 06/13/24 1932     (approximate)   History   Leg Pain and Fall (Patient treated for fall yesterday, found to have left ankle fracture, tonight with pain to right femur ? FX )   HPI  Donna Adkins is a 78 y.o. female past medical history significant for multiple sclerosis, hyperlipidemia, who presents to the emergency department with concern for femur fracture.  History is provided by the physician over the phone.  States that she was seen yesterday following a fall and had x-ray imaging done.  Today she had x-rays of her right leg and has an acute femur fracture so she was sent back to the emergency department.  Patient without complaints at this time.  Denies any shortness of breath or chest pain.       Physical Exam   Triage Vital Signs: ED Triage Vitals  Encounter Vitals Group     BP 06/13/24 1935 97/68     Girls Systolic BP Percentile --      Girls Diastolic BP Percentile --      Boys Systolic BP Percentile --      Boys Diastolic BP Percentile --      Pulse Rate 06/13/24 1935 83     Resp 06/13/24 1948 16     Temp 06/13/24 1934 99 F (37.2 C)     Temp Source 06/13/24 1934 Oral     SpO2 06/13/24 1935 100 %     Weight 06/13/24 1938 196 lb (88.9 kg)     Height 06/13/24 1938 5' 7 (1.702 m)     Head Circumference --      Peak Flow --      Pain Score 06/13/24 1938 0     Pain Loc --      Pain Education --      Exclude from Growth Chart --     Most recent vital signs: Vitals:   06/13/24 2030 06/13/24 2238  BP: 95/62 97/76  Pulse: 80 87  Resp: 14 18  Temp:  98.6 F (37 C)  SpO2: 97% 100%    Physical Exam Constitutional:      Appearance: She is well-developed.  HENT:     Head: Atraumatic.  Eyes:     Extraocular Movements: Extraocular movements intact.     Conjunctiva/sclera: Conjunctivae normal.     Pupils: Pupils are equal, round, and reactive to  light.  Cardiovascular:     Rate and Rhythm: Regular rhythm.  Pulmonary:     Effort: No respiratory distress.  Abdominal:     General: There is no distension.     Tenderness: There is no abdominal tenderness.  Musculoskeletal:        General: Normal range of motion.     Cervical back: Normal range of motion.     Right lower leg: Edema present.     Left lower leg: Edema present.     Comments: No significant pain with range of motion to the right leg.  Has a splint to the left lower extremity  Skin:    General: Skin is warm.     Capillary Refill: Capillary refill takes less than 2 seconds.  Neurological:     General: No focal deficit present.     Mental Status: She is alert. Mental status is at baseline.  Psychiatric:        Mood and Affect: Mood normal.  IMPRESSION / MDM / ASSESSMENT AND PLAN / ED COURSE  I reviewed the triage vital signs and the nursing notes.  Differential diagnosis including femur fracture, lower extremity fracture   No tachycardic or bradycardic dysrhythmias while on cardiac telemetry.  RADIOLOGY I independently reviewed imaging, my interpretation of imaging: X-ray imaging with prior fractures.  No acute fracture.  No change when compared to prior x-ray.  Discussed the radiology report with Dr. Tracey LEOS (all labs ordered are listed, but only abnormal results are displayed) Labs interpreted as -    Labs Reviewed  CBC - Abnormal; Notable for the following components:      Result Value   RBC 3.19 (*)    Hemoglobin 10.2 (*)    HCT 29.4 (*)    All other components within normal limits  BASIC METABOLIC PANEL WITH GFR - Abnormal; Notable for the following components:   Sodium 128 (*)    Glucose, Bld 113 (*)    All other components within normal limits     MDM  Patient found to have mild hyponatremia with a sodium of 128 with a baseline in the 130s.  No recent lab work obtained.  No significant altered mental status and appears to be at her  mental status baseline.  Creatinine at baseline.  Does not appear significantly dehydrated.  Mild findings of hypervolemia with lower extremity edema.  X-ray imaging with no acute fracture and unchanged when compared to prior.  Given that the patient does not have any femur fracture or lower extremity fracture and they do appear old feel that she would be safe to be discharged back to the facility.  Discussed with Dr. Antonetta over the phone.  Discussed that she need repeated sodium level and if continues to be low or if it worsens she needs to return to the emergency department.  No questions at time of discharge.     PROCEDURES:  Critical Care performed: No  Procedures  Patient's presentation is most consistent with acute presentation with potential threat to life or bodily function.   MEDICATIONS ORDERED IN ED: Medications - No data to display  FINAL CLINICAL IMPRESSION(S) / ED DIAGNOSES   Final diagnoses:  Fall, subsequent encounter  Hyponatremia     Rx / DC Orders   ED Discharge Orders     None        Note:  This document was prepared using Dragon voice recognition software and may include unintentional dictation errors.   Suzanne Kirsch, MD 06/13/24 [phone]

## 2024-06-13 NOTE — ED Notes (Signed)
 Called to [facility] per RN Harlene @305am Sharlon to [facility].

## 2024-06-13 NOTE — ED Notes (Signed)
 Fall risk bundle is currently in place, however due to swelling in feet did not place non skid socks on.

## 2024-06-14 LAB — SAMPLE TO BLOOD BANK

## 2024-06-14 NOTE — ED Notes (Signed)
 Vernell from [employer] called as pts legal guardian and made aware of todays visit .
